# Patient Record
Sex: Male | Born: 1940
Health system: Southern US, Community
[De-identification: ages and names within clinical notes are randomized; demographics above are authoritative.]

## PROBLEM LIST (undated history)

## (undated) DIAGNOSIS — H409 Unspecified glaucoma: Secondary | ICD-10-CM

## (undated) DIAGNOSIS — I10 Essential (primary) hypertension: Secondary | ICD-10-CM

## (undated) DIAGNOSIS — G5602 Carpal tunnel syndrome, left upper limb: Secondary | ICD-10-CM

## (undated) DIAGNOSIS — K219 Gastro-esophageal reflux disease without esophagitis: Secondary | ICD-10-CM

## (undated) DIAGNOSIS — R351 Nocturia: Secondary | ICD-10-CM

## (undated) DIAGNOSIS — M1990 Unspecified osteoarthritis, unspecified site: Secondary | ICD-10-CM

## (undated) DIAGNOSIS — C61 Malignant neoplasm of prostate: Secondary | ICD-10-CM

## (undated) DIAGNOSIS — I2510 Atherosclerotic heart disease of native coronary artery without angina pectoris: Secondary | ICD-10-CM

## (undated) DIAGNOSIS — F419 Anxiety disorder, unspecified: Secondary | ICD-10-CM

## (undated) DIAGNOSIS — Z860100 Personal history of colon polyps, unspecified: Secondary | ICD-10-CM

## (undated) HISTORY — PX: CATARACT EXTRACTION W/ INTRAOCULAR LENS  IMPLANT, BILATERAL: SHX1307

## (undated) HISTORY — PX: PROSTATE BIOPSY: SHX241

## (undated) HISTORY — PX: OTHER SURGICAL HISTORY: SHX169

## (undated) HISTORY — PX: SHOULDER ARTHROSCOPY DISTAL CLAVICLE EXCISION AND OPEN ROTATOR CUFF REPAIR: SHX2396

## (undated) HISTORY — PX: COLONOSCOPY W/ POLYPECTOMY: SHX1380

## (undated) HISTORY — PX: CARPAL TUNNEL RELEASE: SHX101

---

## 2007-06-30 ENCOUNTER — Ambulatory Visit (HOSPITAL_BASED_OUTPATIENT_CLINIC_OR_DEPARTMENT_OTHER): Admission: RE | Admit: 2007-06-30 | Discharge: 2007-06-30 | Payer: Self-pay | Admitting: Orthopedic Surgery

## 2008-03-07 ENCOUNTER — Ambulatory Visit: Payer: Self-pay | Admitting: Gynecology

## 2008-08-06 ENCOUNTER — Ambulatory Visit (HOSPITAL_BASED_OUTPATIENT_CLINIC_OR_DEPARTMENT_OTHER): Admission: RE | Admit: 2008-08-06 | Discharge: 2008-08-07 | Payer: Self-pay | Admitting: Orthopedic Surgery

## 2009-05-20 ENCOUNTER — Encounter (INDEPENDENT_AMBULATORY_CARE_PROVIDER_SITE_OTHER): Payer: Self-pay | Admitting: Surgery

## 2009-05-20 ENCOUNTER — Observation Stay (HOSPITAL_COMMUNITY): Admission: RE | Admit: 2009-05-20 | Discharge: 2009-05-21 | Payer: Self-pay | Admitting: Surgery

## 2010-03-05 ENCOUNTER — Ambulatory Visit: Payer: Self-pay | Admitting: Genetic Counselor

## 2010-09-15 LAB — CBC
HCT: 43.5 % (ref 39.0–52.0)
MCHC: 34.6 g/dL (ref 30.0–36.0)
Platelets: 186 10*3/uL (ref 150–400)
RDW: 12.7 % (ref 11.5–15.5)

## 2010-09-15 LAB — DIFFERENTIAL
Lymphocytes Relative: 28 % (ref 12–46)
Lymphs Abs: 1.2 10*3/uL (ref 0.7–4.0)
Monocytes Absolute: 0.5 10*3/uL (ref 0.1–1.0)
Monocytes Relative: 11 % (ref 3–12)
Neutro Abs: 2.6 10*3/uL (ref 1.7–7.7)
Neutrophils Relative %: 60 % (ref 43–77)

## 2010-09-15 LAB — COMPREHENSIVE METABOLIC PANEL
Albumin: 4.2 g/dL (ref 3.5–5.2)
Alkaline Phosphatase: 77 U/L (ref 39–117)
BUN: 13 mg/dL (ref 6–23)
Calcium: 10 mg/dL (ref 8.4–10.5)
Creatinine, Ser: 0.98 mg/dL (ref 0.4–1.5)
Glucose, Bld: 93 mg/dL (ref 70–99)
Potassium: 4.5 mEq/L (ref 3.5–5.1)
Total Protein: 7.4 g/dL (ref 6.0–8.3)

## 2010-09-15 LAB — URINALYSIS, ROUTINE W REFLEX MICROSCOPIC
Bilirubin Urine: NEGATIVE
Glucose, UA: NEGATIVE mg/dL
Hgb urine dipstick: NEGATIVE
Specific Gravity, Urine: 1.009 (ref 1.005–1.030)
Urobilinogen, UA: 0.2 mg/dL (ref 0.0–1.0)
pH: 6 (ref 5.0–8.0)

## 2010-09-29 LAB — BASIC METABOLIC PANEL
CO2: 26 mEq/L (ref 19–32)
Calcium: 9.8 mg/dL (ref 8.4–10.5)
GFR calc Af Amer: >60 mL/min (ref >60)
Glucose, Bld: 103 mg/dL — ABNORMAL HIGH (ref 70–99)
Potassium: 3.8 mEq/L (ref 3.5–5.1)
Sodium: 143 mEq/L (ref 135–145)

## 2010-10-27 NOTE — Op Note (Signed)
NAMESAHITH, NURSE NO.:  [id number]   MEDICAL RECORD NO.:  [id number]          PATIENT TYPE:  AMB   LOCATION:  DSC                          FACILITY:  [facility]   PHYSICIAN:  Katy Fitch. Sypher, M.D. DATE OF BIRTH:  Oct 10, 1940   DATE OF PROCEDURE:  08/06/2008  DATE OF DISCHARGE:                               OPERATIVE REPORT   PREOPERATIVE DIAGNOSES:  1. Chronic degenerative rotator cuff tear, right shoulder, documented      by preoperative MRI.  2. Degenerative arthritis of acromioclavicular joint with unfavorable      inferior distal clavicle osteophyte and medial acromial osteophyte.  3. MRI documented labral tear, biceps tear.  4. Adhesive capsulitis, documented by examination of shoulder under      anesthesia prior to arthroscopy.   POSTOPERATIVE DIAGNOSES:  1. Significant degenerative type 1 superior labrum anterior and      posterior lesion.  2. 90-99% frayed biceps tendon tear.  3. Adhesive capsulitis documented by preoperative examination of right      shoulder under anesthesia and confirmed by direct inspection.  4. 95-99% PASTA articular surface rotator cuff tear involving entire      supraspinatus and infraspinatus tendons,.  5. Florid subacromial bursitis due to chronic impingement.   OPERATION:  1. Examination of right shoulder under anesthesia.  2. Diagnostic arthroscopy followed by arthroscopic debridement of      labrum, biceps tendon with biceps tenotomy.  3. Extensive debridement and electrocautery of adhesive capsulitis      granulation tissues.  4. Arthroscopic subacromial decompression with bursectomy,      coracoacromial ligament relaxation and anterior medial      acromioplasty.  5. Arthroscopic distal clavicle resection.  6. Reconstruction of rotator cuff with hybrid technique with      arthroscopic debridement and tuberosity plasty followed by open      double-row suture placement.  7. Biceps tenodesis at bicipital groove.   OPERATIONS:  Katy Fitch. Sypher, MD   ASSISTANT:  Marveen Reeks Dasnoit, PA-C   ANESTHESIA:  General endotracheal, supplemented by right interscalene  block.   SUPERVISING ANESTHESIOLOGIST:  Janetta Hora. 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  He has had a progressive right  shoulder pain predicament with night pain, weakness of abduction and  external rotation, difficulty reaching behind his back, and progressive  golf impairment.  We had discussed the shoulder predicament during the  past several months and identified a degenerative rotator cuff tear,  biceps pathology, and AC degenerative arthritis.  We attempted to rehab  his shoulder with therapy and activity modification, however, due to a  failure to respond he requested that we proceed with reconstruction of  his rotator cuff.   Larry Roberts goal is to be able to return to playing golf in the  summer of 2010.   Preoperatively, he was advised that based on his clinical examination  and MRI, he likely had a 95-99% articular surface rotator cuff tear,  extensive biceps pathology, labral pathology and due to his progressive  stiffness likely adhesive capsulitis.  We advised him that we proceed with examination of shoulder under  anesthesia, arthroscopic evaluation of the shoulder, subacromial  decompression, intraarticular debridement, distal clavicular resection,  and either arthroscopic or open reconstruction of the rotator cuff.   After informed consent, he is brought to the operating room at this  time.   Preoperatively, he was interviewed by Dr. Gelene Mink of the Anesthesia  Service.  Dr. Gelene Mink recommended general endotracheal anesthesia  supplemented by interscalene block.  Based on Larry Roberts age and  other factors, we elected to proceed with a lidocaine block for a short-  acting duration and less phrenic nerve  impairment.   After informed consent, Larry Roberts is brought to the operating room  at this time.   In the holding area, Dr. Gelene Mink placed the interscalene block without  complication leading to excellent anesthesia of the forequarter and arm.   Larry Roberts was brought to room #6, placed in the supine position on  the operating table, and under Dr. Thornton Dales direct supervision  general endotracheal anesthesia was induced.  He was carefully  positioned in the beach-chair position with aid of a torso and head  holder designed for shoulder arthroscopy.  Ancef 1 g was administered in  the holding area and 1 g of vancomycin was administered intraoperatively  as the anchors were placed.  The entire right upper extremity and  forequarter were prepped with DuraPrep and draped with impervious  arthroscopy drapes.  Examination of the right shoulder under anesthesia  revealed combined elevation of 160, loss of extension of at least 30  degrees due to adhesive capsulitis, external rotation of 80 degrees and  internal rotation of 60 degrees.  He clearly was losing motion due to  adhesive capsulitis and capsular scarring.  The scope was introduced  through a standard posterior viewing portal after distending the  shoulder with 40 mL of sterile saline.  Diagnostic arthroscopy confirmed  a severely degenerative biceps tendon.  A delaminated retracted partial  articular surface rotator cuff tear that involved 95-99% of the  footprint of the infraspinatus and supraspinatus.  The biceps tendon was  about 95% degenerative and hanging within the joint in shreds.  We  elected to proceed with a biceps tenotomy and tenodesis.   The adhesive capsulitis granulation tissues were meticulously debrided  followed by arthroscopic debridement of the tuberosity, tuberosity  plasty, and preparation for possible arthroscopic rotator cuff repair.   We then proceeded to debride the labrum and document the  pathology with  the digital camera.  The scope was then placed in the subacromial space  followed by bursectomy, relaxation of coracoacromial ligament, and  leveling the acromion to a type 1 morphology.  The capsule of AC joint  was identified followed by photographic documentation of the significant  degenerative arthritis of the distal clavicle.  The arthroscopic bur was  used to remove the distal 15 mm of the distal clavicle.  After  hemostasis achieved, we proceeded to evaluate for the fitness for an  arthroscopic repair.  Given Larry Roberts desire to return to golf as  soon as possible, I elected to perform a hybrid repair with double-row  technique and combined use of swivel lock, fiber tape technique, and a  standard 2-row FiberWire technique.   We elected to tenodesis the biceps with the anterior sutures, placed at  the anterior supraspinatus.   Two medial anchors were placed, one swivel lock posteriorly at the  center of the infraspinatus, one corkscrew at the  supraspinatus placed  anteriorly to facilitate biceps tenodesis.  The sutures were passed  through the tendon with Scorpion arthroscopic suture passer through a  muscle-splitting incision.  The suture construct involved 3 medial  footprint sutures insetting the PASTA lesion to the anatomic margin of  the articular surface followed by use of an over-the-top technique with  the mattress sutures to a lateral anterior swivel lock and a posterior  PushLock.  A finishing suture was used from the swivel lock to inset the  margin of the supraspinatus.  A very strong 2-row repair was achieved.   The scope was replaced in the glenohumeral joint confirming a  satisfactory biceps tenodesis at the repair, and the remnants of the  biceps tendon was then tenotomized with the suction shaver from an  anterior approach.  We had reestablished an anatomic footprint of the  repair and could visualize our sutures placed at the articular  margin.   After thorough lavage of the glenohumeral joint and subacromial space,  the scope was removed followed by repair of the portals with mattress  suture of 3-0 Prolene and repair of the muscle split with an apex suture  of #2 FiberWire and simple suture of 0 Vicryl.  The incision was  repaired with subcutaneous suture of 2-0 Vicryl and intradermal 3-0  Prolene with Steri-Strips.  There were no apparent complications.   Mr. Villella tolerated the surgery and anesthesia well.  He was  transferred to the recovery room with stable signs.  He will be  discharged in 24 hours to home with prescriptions for Dilaudid 2 mg 1-2  tablets p.o. q.4-6 h. p.r.n. pain, 30 tablets without refill.  Also,  Motrin 600 mg 1 p.o. q.6 h. p.r.n. pain, 30 tablets without refill and  Keflex 500 mg 1 p.o. q.8 h. x4 days as prophylactic antibiotic.      Katy Fitch Sypher, M.D.  Electronically Signed     RVS/MEDQ  D:  08/06/2008  T:  08/07/2008  Job:  [id number]   cc:   Georgann Housekeeper, MD

## 2010-10-27 NOTE — Op Note (Signed)
NAME:  Larry Roberts, Larry Roberts NO.:  [id number]   MEDICAL RECORD NO.:  [id number]          PATIENT TYPE:  AMB   LOCATION:  DSC                          FACILITY:  [facility]   PHYSICIAN:  Katy Fitch. Sypher, M.D. DATE OF BIRTH:  01/03/1941   DATE OF PROCEDURE:  06/30/2007  DATE OF DISCHARGE:                               OPERATIVE REPORT   PREOPERATIVE DIAGNOSES:  1. Entrapment neuropathy median nerve right carpal tunnel.  2. Stenosing tenosynovitis right index finger at A1 pulley.  3. Stenosing tenosynovitis right long finger at A1 pulley.   POSTOPERATIVE DIAGNOSES:  1. Entrapment neuropathy median nerve right carpal tunnel.  2. Stenosing tenosynovitis right index finger at A1 pulley.  3. Stenosing tenosynovitis right long finger at A1 pulley.   OPERATION:  1. Release of right transverse carpal ligament.  2. Release of right index finger A1 pulley.  3. Release of right long finger A1 pulley.   OPERATIONS:  Katy Fitch. Sypher, M.D.   ASSISTANT:  Annye Rusk, PA-C.   ANESTHESIA:  General by LMA.   SUPERVISING ANESTHESIOLOGIST:  Dr. Randa Evens.   INDICATIONS:  Larry Roberts is a 70 year old gentleman referred for  evaluation and management of stenosing tenosynovitis of multiple fingers  and right carpal tunnel syndrome.   Due to a failure to respond to nonoperative measures, he is brought to  the operating room at this time for release of his right transverse  carpal ligament and release of his index and long trigger fingers.  Preoperatively, question were invited and  answered in detail.  He was  advised of potential risks and benefits of surgery.   DESCRIPTION OF PROCEDURE:  Larry Roberts was brought to the operating  room and placed in the supine position upon the operating table.   Following the induction of general anesthesia by LMA technique, the  right arm was prepped with Betadine soap and solution and sterilely  draped.   Following exsanguination of  the right arm with an Esmarch bandage, the  arterial tourniquet was inflated to 220 mmHg.  Procedure commenced with  a short incision in the line of the ring finger and the palm.  Subcutaneous tissues were carefully divided in the middle of the palmar  fascia.  This was split longitudinally to reveal the common sensory  branch of the median nerve.  These were followed back to the transverse  carpal ligament where the median nerve was gently isolated from the  transverse carpal ligament with a Penfield IV Engineer, structural.   The transverse carpal ligament released along its ulnar border extending  into the distal forearm.  This widely opened the carpal canal.  No mass  or other predicaments were noted.   Bleeding points along the margin of the loose ligament were  electrocauterized with bipolar current followed by repair of the skin  with intradermal 3-0 Prolene suture.   Attention then directed to the right index finger.  An oblique incision  was fashioned in the middle palmar crease.  Subcutaneous tissues were  carefully divided taking care to identify the radial proper digital  nerve and artery to the index finger.  Retractors were placed followed  by isolation of the A1 pulley.  An A zero pulley was noted and released  and the A1 pulley was released along its radial border.  The triggering  the finger was then relieved.  The wound was repaired with intradermal 3-  0 Prolene.   Attention then directed to the long finger.  A oblique incision was  fashioned in the distal palmar crease.  Subcutaneous tissues were  carefully divided, taking care to isolate the A1 pulley.  The pulley was  released along its radial border.  Tendon was delivered and hypertrophic  tenosynovium removed.   The wound was then repaired with intradermal 3-0 Prolene suture.   A compressive dressing was applied with a volar plaster splint on the  wrist.  The fingers were dressed with sterile gauze and Ace wrap.   There  were no apparent complications.   For aftercare, Larry Roberts is provided prescription for Percocet 5 mg  one p.o. q.4-6h. p.r.n. pain, 20 tablets without refill.   He will return to see [place] in the office for follow-up in a week for  dressing change and initiation of therapy program.      Katy Fitch. Sypher, M.D.  Electronically Signed     RVS/MEDQ  D:  06/30/2007  T:  06/30/2007  Job:  [id number]

## 2011-03-05 LAB — BASIC METABOLIC PANEL
CO2: 29
Calcium: 9.9
GFR calc Af Amer: >60
GFR calc non Af Amer: >60
Glucose, Bld: 113 — ABNORMAL HIGH
Potassium: 4.8
Sodium: 139

## 2013-07-14 ENCOUNTER — Emergency Department (HOSPITAL_COMMUNITY)
Admission: EM | Admit: 2013-07-14 | Discharge: 2013-07-14 | Disposition: A | Discharge status: Home / Self Care | Payer: Medicare Other | Source: Home / Self Care | Attending: Emergency Medicine | Admitting: Emergency Medicine

## 2013-07-14 ENCOUNTER — Encounter (HOSPITAL_COMMUNITY): Payer: Self-pay | Admitting: Emergency Medicine

## 2013-07-14 DIAGNOSIS — H9192 Unspecified hearing loss, left ear: Secondary | ICD-10-CM

## 2013-07-14 DIAGNOSIS — H9190 Unspecified hearing loss, unspecified ear: Secondary | ICD-10-CM

## 2013-07-14 MED ORDER — PREDNISONE 10 MG PO TABS: ORAL_TABLET | ORAL | Status: DC

## 2013-07-14 NOTE — Discharge Instructions (Signed)
Hearing Loss  A hearing loss is sometimes called deafness. Hearing loss may be partial or total.  CAUSES  Hearing loss may be caused by:   Wax in the ear canal.   Infection of the ear canal.   Infection of the middle ear.   Trauma to the ear or surrounding area.   Fluid in the middle ear.   A hole in the eardrum (perforated eardrum).   Exposure to loud sounds or music.   Problems with the hearing nerve.   Certain medications.  Hearing loss without wax, infection, or a history of injury may mean that the nerve is involved. Hearing loss with severe dizziness, nausea and vomiting or ringing in the ear may suggest a hearing nerve irritation or problems in the middle or inner ear. If hearing loss is untreated, there is a greater likelihood for residual or permanent hearing loss.  DIAGNOSIS  A hearing test (audiometry) assesses hearing loss. The audiometry test needs to be performed by a hearing specialist (audiologist).  TREATMENT  Treatment for recent onset of hearing loss may include:   Ear wax removal.   Medications that kill germs (antibiotics).   Cortisone medications.   Prompt follow up with the appropriate specialist.  Return of hearing depends on the cause of your hearing loss, so proper medical follow-up is important. Some hearing loss may not be reversible, and a caregiver should discuss care and treatment options with you.  SEEK MEDICAL CARE IF:    You have a severe headache, dizziness, or changes in vision.   You have new or increased weakness.   You develop repeated vomiting or other serious medical problems.   You have a fever.  Document Released: 05/31/2005 Document Revised: 08/23/2011 Document Reviewed: 09/25/2009  [employer] Patient Information 2014 [employer].

## 2013-07-14 NOTE — ED Notes (Signed)
C/o left hearing problem States he only hears mumbles Did hear a wind whistling, ringing, and water running noises in ear No treatments tried

## 2013-07-14 NOTE — ED Provider Notes (Signed)
  Chief Complaint   Chief Complaint  Patient presents with  . Hearing Problem    History of Present Illness   Santo MAXON KRESSE is a 73 year old male who had sudden onset of tinnitus described as a roaring or blowing sensation in his left ear along with decreased hearing which began last night. He denies any ear pain. The right ear has been normal. He's had no fever, chills, headache, nasal congestion, rhinorrhea, sore throat, or adenopathy. He denies any neurological symptoms.  Review of Systems   Other than as noted above, the patient denies any of the following symptoms: Systemic:  No fevers, chills, or sweats. Eye:  No redness, pain, discharge, itching, blurred vision, or diplopia. ENT:  No headache, nasal congestion, sneezing, itching, epistaxis, ear pain, congestion, decreased hearing, ringing in ears, vertigo, or tinnitus.  No oral lesions, sore throat, pain on swallowing, or hoarseness. Neck:  No mass, tenderness or adenopathy. Lungs:  No coughing, wheezing, or shortness of breath. Skin:  No rash or itching.  [place]   Past medical history, family history, social history, meds, and allergies were reviewed. He is allergic to tetanus vaccine. He takes medicine for his blood pressure, aspirin, and Crestor.  Physical Examination     Vital signs:  BP 167/93  Pulse 90  Temp(Src) 98.5 F (36.9 C) (Oral)  Resp 18  SpO2 99% General:  Alert and oriented.  In no distress.  Skin warm and dry. Eye:  PERRL, full EOMs, lids and conjunctiva normal.   ENT:  TMs and canals clear.  Nasal mucosa not congested and without drainage.  Mucous membranes moist, no oral lesions, normal dentition, pharynx clear.  No cranial or facial pain to palplation. Hearing was diminished the fingers rubbed together on the left side. He was able to be or whispered numbers on both sides equally. Neck:  Supple, full ROM.  No adenopathy, tenderness or mass.  Thyroid normal. Lungs:  Breath sounds clear and equal  bilaterally.  No wheezes, rales or rhonchi. Heart:  Rhythm regular, without extrasystoles.  No gallops or murmers. Skin:  Clear, warm and dry.   Assessment   The encounter diagnosis was Hearing loss in left ear.  Sudden unexplained hearing loss in left ear suggest either vascular or inflammatory cause. Will treat with prednisone and have him followup with ENT early next week.  Plan    1.  Meds:  The following meds were prescribed:   Discharge Medication List as of 07/14/2013 10:00 AM    START taking these medications   Details  predniSONE (DELTASONE) 10 MG tablet Take 4 tablets daily for 4 days, 2 tablets daily for 4 days, 1 tablet daily for 4 days., Normal        2.  Patient Education/Counseling:  The patient was given appropriate handouts, self care instructions, and instructed in symptomatic relief.    3.  Follow up:  The patient was told to follow up here if no better in 3 to 4 days, or sooner if becoming worse in any way, and given some red flag symptoms such as vertigo or any new neurological symptoms which would prompt immediate return.  Follow up with Dr. Erik Obey as soon as possible.     Harden Mo, MD 07/14/13 [phone]

## 2014-04-29 ENCOUNTER — Ambulatory Visit
Admission: RE | Admit: 2014-04-29 | Discharge: 2014-04-29 | Disposition: A | Discharge status: Home / Self Care | Payer: Medicare Other | Source: Ambulatory Visit | Attending: Internal Medicine | Admitting: Internal Medicine

## 2014-04-29 ENCOUNTER — Other Ambulatory Visit: Payer: Self-pay | Admitting: Internal Medicine

## 2014-04-29 DIAGNOSIS — R079 Chest pain, unspecified: Secondary | ICD-10-CM

## 2016-06-14 HISTORY — PX: SHOULDER ARTHROSCOPY WITH OPEN ROTATOR CUFF REPAIR: SHX6092

## 2017-07-14 ENCOUNTER — Other Ambulatory Visit: Payer: Self-pay | Admitting: Urology

## 2017-07-14 DIAGNOSIS — C61 Malignant neoplasm of prostate: Secondary | ICD-10-CM

## 2017-07-26 ENCOUNTER — Ambulatory Visit
Admission: RE | Admit: 2017-07-26 | Discharge: 2017-07-26 | Disposition: A | Discharge status: Home / Self Care | Payer: Medicare Other | Source: Ambulatory Visit | Attending: Urology | Admitting: Urology

## 2017-07-26 DIAGNOSIS — C61 Malignant neoplasm of prostate: Secondary | ICD-10-CM

## 2017-07-26 MED ORDER — GADOBENATE DIMEGLUMINE 529 MG/ML IV SOLN
19.0000 mL | Freq: Once | INTRAVENOUS | Status: AC | PRN
  Administered 2017-07-26: 19 mL via INTRAVENOUS

## 2017-09-26 ENCOUNTER — Telehealth: Payer: Self-pay | Admitting: Medical Oncology

## 2017-09-26 NOTE — Telephone Encounter (Signed)
I called pt to introduce myself as the Prostate Nurse Navigator and the Coordinator of the [facility].  1. I confirmed with the patient he is aware of his referral to the clinic 10/04/17 arriving at 12:30pm.  2. I discussed the format of the clinic and the physicians he will be seeing that day.  3. I discussed where the clinic is located and how to contact me. He informed me his wife is a breast cancer survivor and sees Dr. Jana Hakim so he is very familiar with the cancer center.  4. I confirmed his address and informed him I would be mailing a packet of information and forms to be completed. I asked him to bring them with him the day of his appointment.   He voiced understanding of the above. I asked him to call me if he has any questions or concerns regarding his appointments or the forms he needs to complete.

## 2017-09-27 ENCOUNTER — Encounter: Payer: Self-pay | Admitting: Medical Oncology

## 2017-10-03 ENCOUNTER — Encounter: Payer: Self-pay | Admitting: Radiation Oncology

## 2017-10-03 ENCOUNTER — Telehealth: Payer: Self-pay | Admitting: Medical Oncology

## 2017-10-03 DIAGNOSIS — C61 Malignant neoplasm of prostate: Secondary | ICD-10-CM | POA: Insufficient documentation

## 2017-10-03 NOTE — Telephone Encounter (Signed)
Called patient and spoke with his wife to confirm appointment for [facility] arriving at 12:30pm. I reminded him to bring his completed medical forms and to have lunch before arrival due to the length of the clinic.

## 2017-10-03 NOTE — Progress Notes (Signed)
GU Location of Tumor / Histology: prostatic adenocarcinoma  If Prostate Cancer, Gleason Score is (3 + 3) and PSA is (3.74) on 09/15/2017. Prostate volume: 62.8 cc.  Radene Journey presented to Dr. Alinda Money in September 2017 with an elevated PSA of 4.69.   09/25/2017 PSA  3.74 03/16/2017 PSA  4.45 09/15/2016 PSA  3.96 01/2017  PSA  6.81 12/23/2016 PSA  6.80 03/11/2016 PSA  3.47 2013  PSA  3.38   Biopsies of prostate (if applicable) revealed:    Past/Anticipated interventions by urology, if any: prostate biopsy  Past/Anticipated interventions by medical oncology, if any: no  Weight changes, if any: no  Bowel/Bladder complaints, if any: nocturia   Nausea/Vomiting, if any: no  Pain issues, if any:  no  SAFETY ISSUES:  Prior radiation? no  Pacemaker/ICD? no  Possible current pregnancy? no  Is the patient on methotrexate? no  Current Complaints / other details:  77 year old male. Married. 2 daughters. Brother with hx of prostate ca.

## 2017-10-04 ENCOUNTER — Encounter: Payer: Self-pay | Admitting: General Practice

## 2017-10-04 ENCOUNTER — Other Ambulatory Visit: Payer: Self-pay

## 2017-10-04 ENCOUNTER — Encounter: Payer: Self-pay | Admitting: Radiation Oncology

## 2017-10-04 ENCOUNTER — Encounter: Payer: Self-pay | Admitting: Medical Oncology

## 2017-10-04 ENCOUNTER — Ambulatory Visit
Admission: RE | Admit: 2017-10-04 | Discharge: 2017-10-04 | Disposition: A | Discharge status: Home / Self Care | Payer: Medicare Other | Source: Ambulatory Visit | Attending: Radiation Oncology | Admitting: Radiation Oncology

## 2017-10-04 ENCOUNTER — Inpatient Hospital Stay: Payer: Medicare Other | Attending: Oncology | Admitting: Oncology

## 2017-10-04 VITALS — BP 157/78 | HR 62 | Temp 98.0°F | Resp 18 | Ht 70.0 in | Wt 196.6 lb

## 2017-10-04 DIAGNOSIS — K219 Gastro-esophageal reflux disease without esophagitis: Secondary | ICD-10-CM | POA: Diagnosis not present

## 2017-10-04 DIAGNOSIS — Z79899 Other long term (current) drug therapy: Secondary | ICD-10-CM | POA: Diagnosis not present

## 2017-10-04 DIAGNOSIS — Z806 Family history of leukemia: Secondary | ICD-10-CM | POA: Insufficient documentation

## 2017-10-04 DIAGNOSIS — Z8042 Family history of malignant neoplasm of prostate: Secondary | ICD-10-CM | POA: Diagnosis not present

## 2017-10-04 DIAGNOSIS — C61 Malignant neoplasm of prostate: Secondary | ICD-10-CM | POA: Insufficient documentation

## 2017-10-04 DIAGNOSIS — Z7982 Long term (current) use of aspirin: Secondary | ICD-10-CM | POA: Diagnosis not present

## 2017-10-04 HISTORY — DX: Malignant neoplasm of prostate: C61

## 2017-10-04 HISTORY — DX: Personal history of colon polyps, unspecified: Z86.0100

## 2017-10-04 HISTORY — DX: Gastro-esophageal reflux disease without esophagitis: K21.9

## 2017-10-04 HISTORY — DX: Personal history of colonic polyps: Z86.010

## 2017-10-04 NOTE — Progress Notes (Signed)
Radiation Oncology         [phone] ________________________________  Multidisciplinary Prostate Cancer Clinic  Initial Radiation Oncology Consultation  Name: Larry Roberts MRN: [id number]  Date: 10/04/2017  DOB: March 29, 1941  YP:[id number], D[place] Ar, MD  Raynelle Bring, MD   REFERRING PHYSICIAN: Raynelle Bring, MD  DIAGNOSIS: 77 y.o. gentleman with stage T2a adenocarcinoma of the prostate with a Gleason's score of 3+4 and a PSA of 3.74    ICD-10-CM   1. Malignant neoplasm of prostate ([place]) [place] ILLNESS::Larry Roberts is a 77 y.o. gentleman with history of elevated PSA and prostate cancer.  PSA's  02/14/14 - 3.29  02/27/15 - 3.58  08/28/15 - 3.77  03/04/16 - 4.69  03/11/16 - 3.47  09/02/16 - 4.82  09/15/16 - 3.96  12/23/16 - 6.8   01/21/17 - TRUS vol = 62.8   5/12 cores positive max Gleason 3+3   Elected active surveillance at the time because his wife was diagnosed with breast cancer.  07/26/17 - MRI prostate 13 mm PI-RADS 4 lesion the right anterior apical gland. No signs of extracapsular extension or seminal vesicle involvement.  09/15/17 - PSA 3.74  09/15/17 - TRUS vol 71 gm with MRI Fusion Biopsy    4/12 cores positive with max Gleason 3+4 and 2/2 ROI cores both positive for 3+4      The patient reviewed the biopsy results with his urologist and he has kindly been referred today to the multidisciplinary prostate cancer clinic for presentation of pathology and radiology studies in our conference for discussion of potential radiation treatment options and clinical evaluation.  Of note, the patient has a history of skin cancer diagnosed at age 77 that was removed.   PREVIOUS RADIATION THERAPY: No  PAST MEDICAL HISTORY:  has a past medical history of GERD (gastroesophageal reflux disease), Glaucoma, colonic polyps, and Prostate cancer ([place]).    PAST SURGICAL HISTORY: Past Surgical History:  Procedure Laterality Date  . APPENDECTOMY    .  CARPAL TUNNEL RELEASE Right   . COLONOSCOPY W/ POLYPECTOMY    . PROSTATE BIOPSY    . SHOULDER SURGERY Left   . SHOULDER SURGERY Right     FAMILY HISTORY: family history includes Breast cancer in his daughter, sister, and sister; Hodgkin's lymphoma in his daughter; Melanoma in his daughter; Prostate cancer in his brother; Throat cancer in his father; Thyroid cancer in his sister.  SOCIAL HISTORY:  reports that he has never smoked. He has never used smokeless tobacco. He reports that he does not drink alcohol or use drugs.  ALLERGIES: Tetanus toxoids  MEDICATIONS:  Current Outpatient Medications  Medication Sig Dispense Refill  . ALPRAZolam (XANAX) 0.5 MG tablet   0  . amLODipine (NORVASC) 5 MG tablet   0  . Coenzyme Q10 (CO Q 10 PO) Take by mouth.    . dorzolamide-timolol (COSOPT) 22.3-6.8 MG/ML ophthalmic solution   6  . latanoprost (XALATAN) 0.005 % ophthalmic solution INSTILL 1 DROP INTO BOTH EYES NIGHTLY  2  . Multiple Vitamins-Minerals (OSTEO COMPLEX PO) Take by mouth.    . rosuvastatin (CRESTOR) 5 MG tablet Take 5 mg by mouth daily.     No current facility-administered medications for this encounter.     REVIEW OF SYSTEMS:  A 15 point review of systems is documented in the electronic medical record. This was obtained by the nursing staff. However, I reviewed this with the patient to discuss relevant findings and make appropriate changes.  Pertinent items noted in HPI and remainder of comprehensive ROS otherwise negative. The patient completed an IPSS and IIEF questionnaire.  His IPSS score was 9 indicating moderate urinary outflow obstructive symptoms of frequency, weak stream, and nocturia x3.  He indicated that his erectile function is able to complete sexual activity most times.   PHYSICAL EXAM: This patient is in no acute distress.  He is alert and oriented.   height is 5\' 10"  (1.778 m) and weight is 196 lb 9.6 oz (89.2 kg). His oral temperature is 98 F (36.7 C). His blood  pressure is 157/78 (abnormal) and his pulse is 62. His respiration is 18 and oxygen saturation is 100%.  He exhibits no respiratory distress or labored breathing.  He appears neurologically intact.  His mood is pleasant.  His affect is appropriate.  Lungs are clear to auscultation.  Heart is regular in rate and rhythm. Please note the digital rectal exam findings described above.  KPS = 100  100 - Normal; no complaints; no evidence of disease. 90   - Able to carry on normal activity; minor signs or symptoms of disease. 80   - Normal activity with effort; some signs or symptoms of disease. 55   - Cares for self; unable to carry on normal activity or to do active work. 60   - Requires occasional assistance, but is able to care for most of his personal needs. 50   - Requires considerable assistance and frequent medical care. 36   - Disabled; requires special care and assistance. 33   - Severely disabled; hospital admission is indicated although death not imminent. 39   - Very sick; hospital admission necessary; active supportive treatment necessary. 10   - Moribund; fatal processes progressing rapidly. 0     - Dead  Karnofsky DA, Abelmann Clyde, Craver LS and Burchenal [facility] [phone]) The use of the nitrogen mustards in the palliative treatment of carcinoma: with particular reference to bronchogenic carcinoma Cancer 1 [id number]   LABORATORY DATA:  Lab Results  Component Value Date   WBC 4.4 05/15/2009   HGB 15.1 05/15/2009   HCT 43.5 05/15/2009   MCV 89.4 05/15/2009   PLT 186 05/15/2009   Lab Results  Component Value Date   NA 142 05/15/2009   K 4.5 05/15/2009   CL 107 05/15/2009   CO2 27 05/15/2009   Lab Results  Component Value Date   ALT 32 05/15/2009   AST 28 05/15/2009   ALKPHOS 77 05/15/2009   BILITOT 0.9 05/15/2009     RADIOGRAPHY: No results found.    IMPRESSION: This gentleman is a 77 y.o. gentleman with stage T2a adenocarcinoma of the prostate with a Gleason's score of 3+4 and  a PSA of 3.74.  His T-Stage, Gleason's Score, and PSA put him into the favorable intermediate risk group.  Accordingly he is eligible for a variety of potential treatment options including prostatectomy or external radiation. He is not an ideal seed implant candidate due to his large prostate volume of 71 cc.  PLAN:Today I reviewed the findings and workup thus far.  We discussed the natural history of prostate cancer.  We reviewed the the implications of T-stage, Gleason's Score, and PSA on decision-making and outcomes in prostate cancer.  We discussed radiation treatment in the management of prostate cancer with regard to the logistics and delivery of external beam radiation treatment. We compared this against prostatectomy.  The patient expressed interest in external beam radiotherapy.    The patient would  like to proceed with prostate IMRT.  I will share my findings with Dr. Alinda Money and move forward with scheduling placement of three gold fiducial markers into the prostate to proceed with IMRT in the near future.     I enjoyed meeting with him today, and will look forward to participating in the care of this very nice gentleman.   I spent time face to face with the patient and more than 50% of that time was spent in counseling and/or coordination of care.   ------------------------------------------------  Sheral Apley. Tammi Klippel, M.D.  This document serves as a record of services personally performed by Tyler Pita, MD. It was created on his behalf by Rae Lips, a trained medical scribe. The creation of this record is based on the scribe's personal observations and the provider's statements to them. This document has been checked and approved by the attending provider.

## 2017-10-04 NOTE — Progress Notes (Signed)
                               Care Plan Summary  Name: Mr. Larry Roberts DOB: 01-01-1941   Your Medical Team:   Urologist -  Dr. Raynelle Bring, [facility]  Radiation Oncologist - Dr. Tyler Pita, [facility]   Medical Oncologist - Dr. Zola Button, [place]  Recommendations: 1) Prostatectomy 2) Radiation - 5 1/2 weeks   * These recommendations are based on information available as of today's consult.      Recommendations may change depending on the results of further tests or exams.   Next Steps: 1) Dr. Alinda Money will schedule fiducial markers and [facility]  2) Dr. Johny Shears office will schedule radiation planning and treatments   When appointments need to be scheduled, you will be contacted by [facility] and/or [facility].  Patient provided with business cards for all team members and given a copy of "Fall Prevention Patient Safety Plan".  Questions?  Please do not hesitate to call Cira Rue, RN, BSN, OCN at [phone] any questions or concerns.  Shirlean Mylar is your Oncology Nurse Navigator and is available to assist you while you're receiving your medical care at [facility].

## 2017-10-04 NOTE — Progress Notes (Signed)
Reason for Referral: Prostate cancer  HPI: 77 year old gentleman currently of [place] where he lived the majority of his life.  He is a rather healthy gentleman who has a history of elevated PSA dating back to August 2018 at that time his PSA was 6.8.  Biopsy in August 2018 showed a Gleason score 3+3 = 6 in 4 out of 12 cores.  This was performed by Dr. Alinda Money and he elected to proceed with active surveillance.  His repeat PSA in April 2018 was 3.74 although an MRI obtained in February 2019 showed a apical nodule that is highly suspicious for high-grade disease.  Repeat biopsy obtained on September 15, 2017 showed multiple cores of Gleason score 3+4 = 7 indicating high-grade malignancy.  He is otherwise asymptomatic from these findings.  He denies any recent urinary symptoms including frequency or hematuria.  He does report some mild nocturia which has been manageable.  He does not report any headaches, blurry vision, syncope or seizures. Does not report any fevers, chills or sweats.  Does not report any cough, wheezing or hemoptysis.  Does not report any chest pain, palpitation, orthopnea or leg edema.  Does not report any nausea, vomiting or abdominal pain.  Does not report any constipation or diarrhea.  Does not report any skeletal complaints.    Does not report frequency, urgency or hematuria.  Does not report any skin rashes or lesions. Does not report any heat or cold intolerance.  Does not report any lymphadenopathy or petechiae.  Does not report any anxiety or depression.  Remaining review of systems is negative.    Past Medical History:  Diagnosis Date  . GERD (gastroesophageal reflux disease)   . Hx of colonic polyps   . Prostate cancer [facility])   :  Past Surgical History:  Procedure Laterality Date  . APPENDECTOMY    . CARPAL TUNNEL RELEASE Right   . COLONOSCOPY W/ POLYPECTOMY    . PROSTATE BIOPSY    . SHOULDER SURGERY Left   . SHOULDER SURGERY Right   :   Current Outpatient  Medications:  .  ALPRAZolam (XANAX) 0.5 MG tablet, , Disp: , Rfl: 0 .  amLODipine (NORVASC) 5 MG tablet, , Disp: , Rfl: 0 .  latanoprost (XALATAN) 0.005 % ophthalmic solution, INSTILL 1 DROP INTO BOTH EYES NIGHTLY, Disp: , Rfl: 2 .  predniSONE (DELTASONE) 10 MG tablet, Take 4 tablets daily for 4 days, 2 tablets daily for 4 days, 1 tablet daily for 4 days., Disp: 28 tablet, Rfl: 0 .  sulfamethoxazole-trimethoprim (BACTRIM DS,SEPTRA DS) 800-160 MG tablet, take 1 tablet by mouth twice a day *BEGIN DAY PROCEDURE AND CONTINUE FOR 3 DAYS*, Disp: , Rfl: 0:  No Known Allergies:  Family History  Problem Relation Age of Onset  . Prostate cancer Brother   . Hodgkin's lymphoma Daughter   . Breast cancer Daughter   . Melanoma Daughter   . Throat cancer Father   . Breast cancer Sister   :  Social History   Socioeconomic History  . Marital status: Married    Spouse name: Not on file  . Number of children: 12  . Years of education: Not on file  . Highest education level: Not on file  Occupational History  . Not on file  Social Needs  . Financial resource strain: Not on file  . Food insecurity:    Worry: Not on file    Inability: Not on file  . Transportation needs:    Medical: Not on file  Non-medical: Not on file  Tobacco Use  . Smoking status: Never Smoker  . Smokeless tobacco: Never Used  Substance and Sexual Activity  . Alcohol use: Never    Frequency: Never  . Drug use: Never  . Sexual activity: Not on file  Lifestyle  . Physical activity:    Days per week: Not on file    Minutes per session: Not on file  . Stress: Not on file  Relationships  . Social connections:    Talks on phone: Not on file    Gets together: Not on file    Attends religious service: Not on file    Active member of club or organization: Not on file    Attends meetings of clubs or organizations: Not on file    Relationship status: Not on file  . Intimate partner violence:    Fear of current or ex  partner: Not on file    Emotionally abused: Not on file    Physically abused: Not on file    Forced sexual activity: Not on file  Other Topics Concern  . Not on file  Social History Narrative  . Not on file  :    Exam: ECOG 0 General appearance: alert and cooperative appeared without distress. Head: atraumatic without any abnormalities. Eyes: conjunctivae/corneas clear. PERRL.  Sclera anicteric. Throat: lips, mucosa, and tongue normal; without oral thrush or ulcers. Resp: clear to auscultation bilaterally without rhonchi, wheezes or dullness to percussion. Cardio: regular rate and rhythm, S1, S2 normal, no murmur, click, rub or gallop GI: soft, non-tender; bowel sounds normal; no masses,  no organomegaly Skin: Skin color, texture, turgor normal. No rashes or lesions Lymph nodes: Cervical, supraclavicular, and axillary nodes normal. Neurologic: Grossly normal without any motor, sensory or deep tendon reflexes. Musculoskeletal: No joint deformity or effusion.  CBC    Component Value Date/Time   WBC 4.4 05/15/2009 1130   RBC 4.87 05/15/2009 1130   HGB 15.1 05/15/2009 1130   HCT 43.5 05/15/2009 1130   PLT 186 05/15/2009 1130   MCV 89.4 05/15/2009 1130   MCHC 34.6 05/15/2009 1130   RDW 12.7 05/15/2009 1130   LYMPHSABS 1.2 05/15/2009 1130   MONOABS 0.5 05/15/2009 1130   EOSABS 0.1 05/15/2009 1130   BASOSABS 0.0 05/15/2009 1130      Chemistry      Component Value Date/Time   NA 142 05/15/2009 1130   K 4.5 05/15/2009 1130   CL 107 05/15/2009 1130   CO2 27 05/15/2009 1130   BUN 13 05/15/2009 1130   CREATININE 0.98 05/15/2009 1130      Component Value Date/Time   CALCIUM 10.0 05/15/2009 1130   ALKPHOS 77 05/15/2009 1130   AST 28 05/15/2009 1130   ALT 32 05/15/2009 1130   BILITOT 0.9 05/15/2009 1130       Assessment and Plan:   77 year old gentleman with prostate cancer diagnosed in August 2018 at that time his PSA was 6.8 and a Gleason score 3+3 = 6.  After  repeat of active surveillance, he developed a a lesion in the anterior right apex suspicious for high-grade microscopic prostate cancer that was a biopsy proven to be Gleason score 3+4 = 7 and 4 cores.  His case was discussed in the prostate cancer multidisciplinary clinic including review of his pathology as well as imaging studies.  Options of therapy were reviewed including primary surgical therapy versus radiation with short course of androgen deprivation.  Risks and benefits of all these approaches were  reviewed today and the rationale for using androgen deprivation in this case was discussed today.  Complication associated with ADT including weight gain, hot flashes, sexual dysfunction among others were reviewed.  I see no role for any additional systemic therapy in the form of chemotherapy or androgen synthesis inhibitors.  After discussion today he is leaning towards proceeding with radiation therapy with the addition of short-term androgen deprivation as well.  All his questions were answered to her satisfaction.  30  minutes was spent with the patient face-to-face today.  More than 50% of time was dedicated to patient counseling, education and coordination of his care.

## 2017-10-04 NOTE — Consult Note (Signed)
[facility]     10/04/2017   --------------------------------------------------------------------------------   Larry Roberts  MRN: [id number]  PRIMARY CARE:  Wenda Low, MD  DOB: Feb 28, 1941, 77 year old Male  REFERRING:  Luvenia Redden  SSN: [id number]  PROVIDER:  Raynelle Bring, M.D.    LOCATION:  [facility] [phone]   --------------------------------------------------------------------------------   CC/HPI: CC: Prostate Cancer   PCP: Dr. Wenda Low  Location of consult: [facility]   Larry Roberts is a 77 year old gentleman who presented to me in the summer of 2018 for a persistently elevated PSA of 6.81 and right sided nodularity of the mid/base gland. He underwent a TRUS biopsy on 01/21/17 that demonstrated Gleason 3+3=6 adenocarcinoma of the prostate in 5 out of 12 biopsies. He initially delayed discussion and a decision about management of his prostate cancer due to the fact that his wife was diagnosed with breast cancer and began her treatment. He eventually returned to discuss his options in October 2018 and we agreed to proceed with an initial strategy of active surveillance. He underwent an MRI of the prostate in February 2019 that did demonstrate a 1.3 cm right anterior apical PI-RADS 4 lesion of the prostate. An MR/US fusion biopsy was then performed on 09/15/17 with targeted biopsies of his lesion and extended core sampling of the prostate. 8 out of 30 biopsies were positive including 4 out of 4 targeted biopsies with upgraded 3+4=7 adenocarcinoma. Interestingly, his PSA performed right before his recent biopsy was decreased to 3.74.   Family history: Brother   Imaging studies: MRI (07/26/17): No EPE, SVI, or LAD.   PMH: He has a history of GERD.  PSH: Appendectomy.   TNM stage: cT2 N0 Mx  PSA: 3.74  Gleason score: 3+4=7  Biopsy (09/15/17): 8/30 cores positive  Left: L  apex (20% of one core, 3+3=6), L mid lateral (5% of one core, 3+3=6), L base lateral (5% of one core, 3+3=6)  Right: R apex lateral (40% of one core, 3+4=7)  MR targeted lesions (1.3 cm right anterior apical): 4/4 cores (70%, 50%, 50%, 50%, 3+4=7)  Prostate volume: 71.0 cc  PSAD: 0.05   Nomogram  OC disease: 56%  EPE: 45%  SVI: 1%  LNI: 1%  PFS (5 year, 10 year): 93%, 87%   Urinary function: IPSS is 9  Erectile function: SHIM score is 20.     ALLERGIES: Levaquin Tetanus    MEDICATIONS: Alprazolam 0.25 mg tablet  Amlodipine Besylate 5 mg tablet  Aspirin Ec 81 mg tablet, delayed release  Coq10  Dorzolamide Hcl 2 % drops  Osteo Bi-Flex  Rosuvastatin Calcium 10 mg tablet  Zantac 150 mg tablet     GU PSH: Prostate Needle Biopsy - 09/15/2017, 01/21/2017    NON-GU PSH: Appendectomy. Carpal Tunnel Surgery.., Right Colonoscopy & Polypectomy Shoulder Arthroscopy/surgery, Left Shoulder Surgery (Unspecified), Right Surgical Pathology, Gross And Microscopic Examination For Prostate Needle - 09/15/2017, 01/21/2017    GU PMH: Prostate Cancer - 03/22/2017 Elevated PSA - 03/11/2016      PMH Notes:   1) Elevated PSA: He initially presented to me in September 2017 with an elevated PSA of 4.69. His PSA was repeated and decreased to 3.47, which was stable from his baseline of 3.38 in 2013. We therefore elected to proceed with surveillance monitoring of his PSA considering his age and life expectancy. His PSA increased to 6.8 in August 2018 prompting a prostate biopsy.  He was diagnosed with low risk prostate cancer and elected active surveillance.   Initial diagnosis: August 2018  Clinical stage: cT1c Nx Mx  PSA at diagnosis: 6.81  Biopsy (01/21/17): 5/12 cores positive - L lateral mid (20%, 3+3=6), L lateral base (50%, 3+3=6), R apex (10%, 3+3=6), R lateral apex (50%, 3+3=6), R lateral base (10%, 3+3=6), Vol 62.8  PSAD: 0.11   Surveillance:  Feb 2019: MRI - 1.3 cm PI-RADS 4 lesion at right  anterior apex  Apr 2019: MR fusion biopsy -     NON-GU PMH: GERD Personal history of colonic polyps    FAMILY HISTORY: 12 daughters - Runs in Family Hodgkin Disease - Daughter Prostate Cancer - Brother, Other   SOCIAL HISTORY: Marital Status: Married Preferred Language: English; Ethnicity: Not Hispanic Or Latino; Race: White Current Smoking Status: Patient has never smoked.  Social Drinker.  Drinks 1 caffeinated drink per day.    REVIEW OF SYSTEMS:    GU Review Male:   Patient denies frequent urination, hard to postpone urination, burning/ pain with urination, get up at night to urinate, leakage of urine, stream starts and stops, trouble starting your streams, and have to strain to urinate .  Gastrointestinal (Upper):   Patient denies nausea and vomiting.  Gastrointestinal (Lower):   Patient denies diarrhea and constipation.  Constitutional:   Patient denies fever, night sweats, weight loss, and fatigue.  Skin:   Patient denies skin rash/ lesion and itching.  Eyes:   Patient denies blurred vision and double vision.  Ears/ Nose/ Throat:   Patient denies sore throat and sinus problems.  Hematologic/Lymphatic:   Patient denies swollen glands and easy bruising.  Cardiovascular:   Patient denies leg swelling and chest pains.  Respiratory:   Patient denies cough and shortness of breath.  Endocrine:   Patient denies excessive thirst.  Musculoskeletal:   Patient denies back pain and joint pain.  Neurological:   Patient denies headaches and dizziness.  Psychologic:   Patient denies depression and anxiety.   VITAL SIGNS: None   MULTI-SYSTEM PHYSICAL EXAMINATION:    Constitutional: Well-nourished. No physical deformities. Normally developed. Good grooming.     PAST DATA REVIEWED:  Source Of History:  Patient  Lab Test Review:   PSA  Records Review:   Pathology Reports, Previous Patient Records  Urine Test Review:   Urinalysis   09/15/17 03/16/17 12/23/16 09/15/16 09/02/16 03/11/16  03/04/16 08/28/15  PSA  Total PSA 3.74 ng/mL 4.45 ng/mL 6.80 ng/mL 3.96 ng/dl 4.86 ng/dl 3.47 ng/dl 4.69 ng/dl 3.77 ng/dl  Free PSA  1.05 ng/mL 2.20 ng/mL       % Free PSA  24 % PSA 32 % PSA         PROCEDURES: None   ASSESSMENT:      ICD-10 Details  1 GU:   Prostate Cancer - C61    PLAN:           Document Letter(s):  Created for Patient: Clinical Summary         Notes:   1. Intermediate risk prostate cancer: I had a detailed discussion with Mr. Rout, his wife, and his daughter today regarding his recent biopsy results. He does have upgraded to Gleason 3+4=7 disease with all for MR targeted biopsies positive with relatively high volume. We discussed the implications of this finding today and how this impacts his recommendations for treatment. He continues to have an excellent life expectancy with minimal medical comorbidities. He has seen both Dr. Tammi Klippel and Dr.  Shadad earlier today.   The patient was counseled about the natural history of prostate cancer and the standard treatment options that are available for prostate cancer. It was explained to him how his age and life expectancy, clinical stage, Gleason score, and PSA affect his prognosis, the decision to proceed with additional staging studies, as well as how that information influences recommended treatment strategies. We discussed the roles for active surveillance, radiation therapy, surgical therapy, androgen deprivation, as well as ablative therapy options for the treatment of prostate cancer as appropriate to his individual cancer situation. We discussed the risks and benefits of these options with regard to their impact on cancer control and also in terms of potential adverse events, complications, and impact on quality of life particularly related to urinary and sexual function. The patient was encouraged to ask questions throughout the discussion today and all questions were answered to his stated satisfaction. In addition,  the patient was provided with and/or directed to appropriate resources and literature for further education about prostate cancer and treatment options.   After reviewing the pros and cons of treatment options and in particular both surgical therapy and primary radiation based therapy, he is interested in proceeding with a treatment of curative intent with radiation. He has been offered hypo fractionated IMRT for 5 and half weeks by Dr. Tammi Klippel. He has discuss the pros and cons of short-term androgen deprivation therapy and wishes to avoid this treatment. He will be scheduled for fiducial marker placement and SpaceOAR insertion in the near future with plans to proceed with IMRT.   CC: Dr. Wenda Low  Dr. Tyler Pita  Dr. Zola Button

## 2017-10-05 ENCOUNTER — Telehealth: Payer: Self-pay

## 2017-10-05 NOTE — Telephone Encounter (Signed)
Per 4/23 no los 

## 2017-10-06 NOTE — Progress Notes (Signed)
[place] Psychosocial Distress Screening Spiritual Care  Met with Banks, wife, and dtr Aldona Bar in [facility] to introduce [place] team/resources, reviewing distress screen per protocol.  The patient scored a 3 on the Psychosocial Distress Thermometer which indicates mild distress. Also assessed for distress and other psychosocial needs.   ONCBCN DISTRESS SCREENING 10/06/2017  Screening Type Initial Screening  Distress experienced in past week (1-10) 3  Emotional problem type Nervousness/Anxiety;Adjusting to illness  Physical Problem type Skin dry/itchy;Tingling hands/feet  Referral to support programs Yes   Pt anticipates that being able to take action to treat dx will help reduce distress. Family reports very strong mutual support due to multiple experiences with cancer: "We've made it through the other ones, so we'll find our way through this one, too."  Served as a witness to their family's stories of cancer dx/tx/distress/healing, providing empathic listening, emotional support, normalization of feelings, and encouragement to utilize [employer] team and programming.   Follow up needed: No. Per family, no other needs at this time, but please page if immediate needs arise or circumstances change. Thank you.   [place], [place], [facility] Pager [phone] Voicemail [phone]

## 2017-10-07 NOTE — Addendum Note (Signed)
Encounter addended by: Tyler Pita, MD on: 10/07/2017 5:49 PM  Actions taken: Visit Navigator Flowsheet section accepted

## 2017-10-07 NOTE — Addendum Note (Signed)
Encounter addended by: Tyler Pita, MD on: 10/07/2017 5:56 PM  Actions taken: Medication List reviewed, Allergies reviewed, Problem List reviewed, LOS modified

## 2017-10-11 ENCOUNTER — Telehealth: Payer: Self-pay | Admitting: Medical Oncology

## 2017-10-11 NOTE — Telephone Encounter (Signed)
Spoke with Mr. Larry Roberts as follow up to the [facility]. He states he is doing well and found clinic helpful. He voiced he has not heard from Dr. Lynne Logan office about an appointment for fiducial's and [place]. I informed him I will send Dr. Alinda Money an e-mail. He voiced understanding. He Is eager to begin his radiation treatments. Message to Dr. Alinda Money with above message.

## 2017-10-18 ENCOUNTER — Other Ambulatory Visit: Payer: Self-pay | Admitting: Urology

## 2017-10-18 ENCOUNTER — Telehealth: Payer: Self-pay | Admitting: *Deleted

## 2017-10-18 NOTE — Telephone Encounter (Signed)
CALLED PATIENT TO INFORM OF FID. MARKER AND SPACE OAR PLACEMENT ON 11-14-17 @ 1 PM @ [facility] AND HIS SIM ON 11-16-17, LVM FOR A RETURN CALL

## 2017-10-26 ENCOUNTER — Other Ambulatory Visit: Payer: Self-pay | Admitting: Urology

## 2017-10-27 ENCOUNTER — Other Ambulatory Visit: Payer: Self-pay | Admitting: Urology

## 2017-10-27 DIAGNOSIS — C61 Malignant neoplasm of prostate: Secondary | ICD-10-CM

## 2017-11-10 ENCOUNTER — Other Ambulatory Visit: Payer: Self-pay

## 2017-11-10 ENCOUNTER — Encounter (HOSPITAL_BASED_OUTPATIENT_CLINIC_OR_DEPARTMENT_OTHER): Payer: Self-pay | Admitting: *Deleted

## 2017-11-10 NOTE — Progress Notes (Signed)
Spoke w/ pt via phone for pre-op interview.  Npo after mn w/ exception clear liquids until 0700 (no cream/ milk products).  Arrive at 1100.  Needs istat and ekg.  Will take norvasc and zantac am dos w/ sips of water and do fleet enema.

## 2017-11-11 NOTE — H&P (Signed)
CC/HPI: CC: Prostate Cancer    Larry Roberts is a 77 year old gentleman who presented to me in the summer of 2018 for a persistently elevated PSA of 6.81 and right sided nodularity of the mid/base gland. He underwent a TRUS biopsy on 01/21/17 that demonstrated Gleason 3+3=6 adenocarcinoma of the prostate in 5 out of 12 biopsies. He initially delayed discussion and a decision about management of his prostate cancer due to the fact that his wife was diagnosed with breast cancer and began her treatment. He eventually returned to discuss his options in October 2018 and we agreed to proceed with an initial strategy of active surveillance. He underwent an MRI of the prostate in February 2019 that did demonstrate a 1.3 cm right anterior apical PI-RADS 4 lesion of the prostate. An MR/US fusion biopsy was then performed on 09/15/17 with targeted biopsies of his lesion and extended core sampling of the prostate. 8 out of 30 biopsies were positive including 4 out of 4 targeted biopsies with upgraded 3+4=7 adenocarcinoma. Interestingly, his PSA performed right before his recent biopsy was decreased to 3.74.   Family history: Brother   Imaging studies: MRI (07/26/17): No EPE, SVI, or LAD.   PMH: He has a history of GERD.  PSH: Appendectomy.   TNM stage: cT2 N0 Mx  PSA: 3.74  Gleason score: 3+4=7  Biopsy (09/15/17): 8/30 cores positive  Left: L apex (20% of one core, 3+3=6), L mid lateral (5% of one core, 3+3=6), L base lateral (5% of one core, 3+3=6)  Right: R apex lateral (40% of one core, 3+4=7)  MR targeted lesions (1.3 cm right anterior apical): 4/4 cores (70%, 50%, 50%, 50%, 3+4=7)  Prostate volume: 71.0 cc  PSAD: 0.05   Nomogram  OC disease: 56%  EPE: 45%  SVI: 1%  LNI: 1%  PFS (5 year, 10 year): 93%, 87%   Urinary function: IPSS is 9  Erectile function: SHIM score is 20.     ALLERGIES: Levaquin Tetanus    MEDICATIONS: Alprazolam 0.25 mg tablet  Amlodipine Besylate 5 mg tablet   Aspirin Ec 81 mg tablet, delayed release  Coq10  Dorzolamide Hcl 2 % drops  Osteo Bi-Flex  Rosuvastatin Calcium 10 mg tablet  Zantac 150 mg tablet     GU PSH: Prostate Needle Biopsy - 09/15/2017, 01/21/2017    NON-GU PSH: Appendectomy. Carpal Tunnel Surgery.., Right Colonoscopy & Polypectomy Shoulder Arthroscopy/surgery, Left Shoulder Surgery (Unspecified), Right Surgical Pathology, Gross And Microscopic Examination For Prostate Needle - 09/15/2017, 01/21/2017    GU PMH: Prostate Cancer - 03/22/2017 Elevated PSA - 03/11/2016      PMH Notes:   1) Elevated PSA: He initially presented to me in September 2017 with an elevated PSA of 4.69. His PSA was repeated and decreased to 3.47, which was stable from his baseline of 3.38 in 2013. We therefore elected to proceed with surveillance monitoring of his PSA considering his age and life expectancy. His PSA increased to 6.8 in August 2018 prompting a prostate biopsy. He was diagnosed with low risk prostate cancer and elected active surveillance.   Initial diagnosis: August 2018  Clinical stage: cT1c Nx Mx  PSA at diagnosis: 6.81  Biopsy (01/21/17): 5/12 cores positive - L lateral mid (20%, 3+3=6), L lateral base (50%, 3+3=6), R apex (10%, 3+3=6), R lateral apex (50%, 3+3=6), R lateral base (10%, 3+3=6), Vol 62.8  PSAD: 0.11   Surveillance:  Feb 2019: MRI - 1.3 cm PI-RADS 4 lesion at right anterior  apex  Apr 2019: MR fusion biopsy -     NON-GU PMH: GERD Personal history of colonic polyps    FAMILY HISTORY: 12 daughters - Runs in Family Hodgkin Disease - Daughter Prostate Cancer - Brother, Other   SOCIAL HISTORY: Marital Status: Married Preferred Language: English; Ethnicity: Not Hispanic Or Latino; Race: White Current Smoking Status: Patient has never smoked.  Social Drinker.  Drinks 1 caffeinated drink per day.    REVIEW OF SYSTEMS:    GU Review Male:   Patient denies frequent urination, hard to postpone urination, burning/ pain  with urination, get up at night to urinate, leakage of urine, stream starts and stops, trouble starting your streams, and have to strain to urinate .  Gastrointestinal (Upper):   Patient denies nausea and vomiting.  Gastrointestinal (Lower):   Patient denies diarrhea and constipation.  Constitutional:   Patient denies fever, night sweats, weight loss, and fatigue.  Skin:   Patient denies skin rash/ lesion and itching.  Eyes:   Patient denies blurred vision and double vision.  Ears/ Nose/ Throat:   Patient denies sore throat and sinus problems.  Hematologic/Lymphatic:   Patient denies swollen glands and easy bruising.  Cardiovascular:   Patient denies leg swelling and chest pains.  Respiratory:   Patient denies cough and shortness of breath.  Endocrine:   Patient denies excessive thirst.  Musculoskeletal:   Patient denies back pain and joint pain.  Neurological:   Patient denies headaches and dizziness.  Psychologic:   Patient denies depression and anxiety.     MULTI-SYSTEM PHYSICAL EXAMINATION:    Constitutional: Well-nourished. No physical deformities. Normally developed. Good grooming.    ASSESSMENT:      ICD-10 Details  1 GU:   Prostate Cancer - C61    PLAN:       1. Intermediate risk prostate cancer:   After reviewing the pros and cons of treatment options and in particular both surgical therapy and primary radiation based therapy, he is interested in proceeding with a treatment of curative intent with radiation. He has been offered hypo fractionated IMRT for 5 and half weeks by Dr. [name]. He has discuss the pros and cons of short-term androgen deprivation therapy and wishes to avoid this treatment. He will be scheduled for fiducial marker placement and SpaceOAR insertion in the near future with plans to proceed with IMRT.

## 2017-11-14 ENCOUNTER — Ambulatory Visit (HOSPITAL_BASED_OUTPATIENT_CLINIC_OR_DEPARTMENT_OTHER)
Admission: RE | Admit: 2017-11-14 | Discharge: 2017-11-14 | Disposition: A | Discharge status: Home / Self Care | Payer: Medicare Other | Source: Ambulatory Visit | Attending: Urology | Admitting: Urology

## 2017-11-14 ENCOUNTER — Ambulatory Visit (HOSPITAL_BASED_OUTPATIENT_CLINIC_OR_DEPARTMENT_OTHER): Payer: Medicare Other | Admitting: Anesthesiology

## 2017-11-14 ENCOUNTER — Encounter (HOSPITAL_BASED_OUTPATIENT_CLINIC_OR_DEPARTMENT_OTHER)
Admission: RE | Disposition: A | Discharge status: Home / Self Care | Payer: Self-pay | Source: Ambulatory Visit | Attending: Urology

## 2017-11-14 ENCOUNTER — Encounter (HOSPITAL_BASED_OUTPATIENT_CLINIC_OR_DEPARTMENT_OTHER): Payer: Self-pay

## 2017-11-14 DIAGNOSIS — Z87891 Personal history of nicotine dependence: Secondary | ICD-10-CM | POA: Diagnosis not present

## 2017-11-14 DIAGNOSIS — Z888 Allergy status to other drugs, medicaments and biological substances status: Secondary | ICD-10-CM | POA: Diagnosis not present

## 2017-11-14 DIAGNOSIS — Z807 Family history of other malignant neoplasms of lymphoid, hematopoietic and related tissues: Secondary | ICD-10-CM | POA: Diagnosis not present

## 2017-11-14 DIAGNOSIS — K219 Gastro-esophageal reflux disease without esophagitis: Secondary | ICD-10-CM | POA: Diagnosis not present

## 2017-11-14 DIAGNOSIS — C61 Malignant neoplasm of prostate: Secondary | ICD-10-CM | POA: Diagnosis not present

## 2017-11-14 DIAGNOSIS — Z887 Allergy status to serum and vaccine status: Secondary | ICD-10-CM | POA: Insufficient documentation

## 2017-11-14 DIAGNOSIS — Z8042 Family history of malignant neoplasm of prostate: Secondary | ICD-10-CM | POA: Insufficient documentation

## 2017-11-14 DIAGNOSIS — Z86010 Personal history of colonic polyps: Secondary | ICD-10-CM | POA: Diagnosis not present

## 2017-11-14 DIAGNOSIS — Z79899 Other long term (current) drug therapy: Secondary | ICD-10-CM | POA: Diagnosis not present

## 2017-11-14 DIAGNOSIS — Z7982 Long term (current) use of aspirin: Secondary | ICD-10-CM | POA: Insufficient documentation

## 2017-11-14 DIAGNOSIS — I10 Essential (primary) hypertension: Secondary | ICD-10-CM | POA: Insufficient documentation

## 2017-11-14 HISTORY — DX: Unspecified glaucoma: H40.9

## 2017-11-14 HISTORY — PX: GOLD SEED IMPLANT: SHX6343

## 2017-11-14 HISTORY — DX: Nocturia: R35.1

## 2017-11-14 HISTORY — PX: SPACE OAR INSTILLATION: SHX6769

## 2017-11-14 HISTORY — DX: Carpal tunnel syndrome, left upper limb: G56.02

## 2017-11-14 HISTORY — DX: Essential (primary) hypertension: I10

## 2017-11-14 HISTORY — DX: Unspecified osteoarthritis, unspecified site: M19.90

## 2017-11-14 LAB — POCT I-STAT 4, (NA,K, GLUC, HGB,HCT)
GLUCOSE: 104 mg/dL — AB (ref 65–99)
HCT: 44 % (ref 39.0–52.0)
HEMOGLOBIN: 15 g/dL (ref 13.0–17.0)
Potassium: 4 mmol/L (ref 3.5–5.1)
SODIUM: 143 mmol/L (ref 135–145)

## 2017-11-14 SURGERY — INSERTION, GOLD SEEDS
Anesthesia: General | Site: Prostate

## 2017-11-14 MED ORDER — PROPOFOL 10 MG/ML IV BOLUS
INTRAVENOUS | Status: AC
  Filled 2017-11-14: qty 40

## 2017-11-14 MED ORDER — CEFTRIAXONE SODIUM 2 G IJ SOLR
INTRAMUSCULAR | Status: AC
  Filled 2017-11-14: qty 20

## 2017-11-14 MED ORDER — METOCLOPRAMIDE HCL 5 MG/ML IJ SOLN
10.0000 mg | Freq: Once | INTRAMUSCULAR | Status: DC | PRN
  Filled 2017-11-14: qty 2

## 2017-11-14 MED ORDER — ONDANSETRON HCL 4 MG/2ML IJ SOLN
INTRAMUSCULAR | Status: AC
  Filled 2017-11-14: qty 2

## 2017-11-14 MED ORDER — SODIUM CHLORIDE 0.9 % IV SOLN
INTRAVENOUS | Status: DC
  Administered 2017-11-14: 11:00:00 via INTRAVENOUS
  Filled 2017-11-14: qty 1000

## 2017-11-14 MED ORDER — FENTANYL CITRATE (PF) 100 MCG/2ML IJ SOLN
25.0000 ug | INTRAMUSCULAR | Status: DC | PRN
  Filled 2017-11-14: qty 1

## 2017-11-14 MED ORDER — FENTANYL CITRATE (PF) 100 MCG/2ML IJ SOLN
INTRAMUSCULAR | Status: DC | PRN
  Administered 2017-11-14: 50 ug via INTRAVENOUS

## 2017-11-14 MED ORDER — DEXAMETHASONE SODIUM PHOSPHATE 10 MG/ML IJ SOLN
INTRAMUSCULAR | Status: DC | PRN
  Administered 2017-11-14: 10 mg via INTRAVENOUS

## 2017-11-14 MED ORDER — SODIUM CHLORIDE 0.9 % IV SOLN
2.0000 g | Freq: Once | INTRAVENOUS | Status: AC
  Administered 2017-11-14: 2 g via INTRAVENOUS
  Filled 2017-11-14: qty 20

## 2017-11-14 MED ORDER — ONDANSETRON HCL 4 MG/2ML IJ SOLN
INTRAMUSCULAR | Status: DC | PRN
  Administered 2017-11-14: 4 mg via INTRAVENOUS

## 2017-11-14 MED ORDER — LIDOCAINE 2% (20 MG/ML) 5 ML SYRINGE
INTRAMUSCULAR | Status: AC
  Filled 2017-11-14: qty 5

## 2017-11-14 MED ORDER — SODIUM CHLORIDE 0.9 % IV SOLN
INTRAVENOUS | Status: AC
  Filled 2017-11-14: qty 100

## 2017-11-14 MED ORDER — PROPOFOL 10 MG/ML IV BOLUS
INTRAVENOUS | Status: DC | PRN
  Administered 2017-11-14: 150 mg via INTRAVENOUS

## 2017-11-14 MED ORDER — LACTATED RINGERS IV SOLN
INTRAVENOUS | Status: DC
  Filled 2017-11-14: qty 1000

## 2017-11-14 MED ORDER — FLEET ENEMA 7-19 GM/118ML RE ENEM
1.0000 | ENEMA | Freq: Once | RECTAL | Status: AC
  Administered 2017-11-14: 1 via RECTAL
  Filled 2017-11-14: qty 1

## 2017-11-14 MED ORDER — MEPERIDINE HCL 25 MG/ML IJ SOLN
6.2500 mg | INTRAMUSCULAR | Status: DC | PRN
  Filled 2017-11-14: qty 1

## 2017-11-14 MED ORDER — DEXAMETHASONE SODIUM PHOSPHATE 10 MG/ML IJ SOLN
INTRAMUSCULAR | Status: AC
  Filled 2017-11-14: qty 1

## 2017-11-14 MED ORDER — FENTANYL CITRATE (PF) 100 MCG/2ML IJ SOLN
INTRAMUSCULAR | Status: AC
  Filled 2017-11-14: qty 2

## 2017-11-14 MED ORDER — LIDOCAINE 2% (20 MG/ML) 5 ML SYRINGE
INTRAMUSCULAR | Status: DC | PRN
  Administered 2017-11-14: 100 mg via INTRAVENOUS

## 2017-11-14 SURGICAL SUPPLY — 16 items
COVER TABLE BACK 60X90 (DRAPES) ×3 IMPLANT
DRSG TEGADERM 4X4.75 (GAUZE/BANDAGES/DRESSINGS) ×2 IMPLANT
DRSG TEGADERM 8X12 (GAUZE/BANDAGES/DRESSINGS) ×4 IMPLANT
GAUZE SPONGE 4X4 12PLY STRL LF (GAUZE/BANDAGES/DRESSINGS) ×2 IMPLANT
GLOVE BIO SURGEON STRL SZ7.5 (GLOVE) ×3 IMPLANT
GLOVE ECLIPSE 8.0 STRL XLNG CF (GLOVE) ×3 IMPLANT
GOWN STRL REUS W/TWL LRG LVL3 (GOWN DISPOSABLE) ×3 IMPLANT
GOWN STRL REUS W/TWL XL LVL3 (GOWN DISPOSABLE) ×3 IMPLANT
IMPL SPACEOAR SYSTEM 10ML (MISCELLANEOUS) ×1 IMPLANT
IMPLANT SPACEOAR SYSTEM 10ML (MISCELLANEOUS) ×3
KIT TURNOVER CYSTO (KITS) ×3 IMPLANT
MARKER GOLD PRELOAD 1.2X3 (Urological Implant) ×1 IMPLANT
PACK CYSTO (CUSTOM PROCEDURE TRAY) ×3 IMPLANT
SEED GOLD PRELOAD 1.2X3 (Urological Implant) ×9 IMPLANT
SURGILUBE 2OZ TUBE FLIPTOP (MISCELLANEOUS) ×3 IMPLANT
UNDERPAD 30X30 (UNDERPADS AND DIAPERS) ×6 IMPLANT

## 2017-11-14 NOTE — Op Note (Signed)
Preoperative diagnosis: Clinically localized adenocarcinoma of the prostate  Postoperative diagnosis: Clinically localized adenocarcinoma of the prostate  Procedure: 1) Transperineal placement of fiducial markers into prostate                    2) Insertion of SpaceOAR hydrogel   Surgeon: Pryor Curia. M.D.  Radiation Oncologist: Dr. Ledon Snare  Anesthesia: General  EBL: Minimal  Complications: None  Indication: Larry Roberts is a 78 y.o. gentleman with clinically localized prostate cancer. After discussing management options for treatment, he elected to proceed with radiotherapy. He presents today for the above procedures. The potential risks, complications, alternative options, and expected recovery course have been discussed in detail with the patient and he has provided informed consent to proceed.  Description of procedure: The patient was taken to the operating room and IV sedation was administered. He was administered preoperative antibiotics, placed in the dorsal lithotomy position, and prepped and draped in the usual sterile fashion. Next, intraoperative transrectal ultrasonography was utilized for real time imaging of the prostate. 3 fiducial markers were then placed into the prostate under direct transrectal ultrasound guidance using triangulation to place them at the left base, right mid and left apex.  A site in the midline was selected on the perineum for placement of an 18 g needle with saline.  The needle was advanced above the rectum and below Denonvillier's fascia to the mid gland and confirmed to be in the midline on transverse imaging.  One cc of saline was injected confirming appropriate expansion of this space.  A total of 5 cc of saline was then injected to open the space further bilaterally.  The saline syringe was then removed and the SpaceOAR hydrogel was injected with good distribution bilaterally. He tolerated the procedure well and without  complications. He was able to be transferred to the recovery unit in satisfactory condition.  He was given a voiding trial in the PACU.

## 2017-11-14 NOTE — Anesthesia Postprocedure Evaluation (Signed)
Anesthesia Post Note  Patient: Larry Roberts  Procedure(s) Performed: GOLD SEED IMPLANT (N/A Prostate) SPACE OAR INSTILLATION (N/A Prostate)     Patient location during evaluation: PACU Anesthesia Type: General Level of consciousness: awake and alert Pain management: pain level controlled Vital Signs Assessment: post-procedure vital signs reviewed and stable Respiratory status: spontaneous breathing, nonlabored ventilation, respiratory function stable and patient connected to nasal cannula oxygen Cardiovascular status: blood pressure returned to baseline and stable Postop Assessment: no apparent nausea or vomiting Anesthetic complications: no    Last Vitals:  Vitals:   11/14/17 1350 11/14/17 1400  BP: 125/86 (!) 163/84  Pulse: 64 (!) 59  Resp: 14 12  Temp: 36.8 C   SpO2: 100% 100%    Last Pain:  Vitals:   11/14/17 1350  TempSrc:   PainSc: 0-No pain                 Larry Roberts

## 2017-11-14 NOTE — Anesthesia Procedure Notes (Signed)
Procedure Name: LMA Insertion Date/Time: 11/14/2017 1:25 PM Performed by: Bonney Aid, CRNA Pre-anesthesia Checklist: Patient identified, Emergency Drugs available, Suction available and Patient being monitored Patient Re-evaluated:Patient Re-evaluated prior to induction Oxygen Delivery Method: Circle system utilized Preoxygenation: Pre-oxygenation with 100% oxygen Induction Type: IV induction Ventilation: Mask ventilation without difficulty LMA: LMA inserted LMA Size: 5.0 Number of attempts: 1 Airway Equipment and Method: Bite block Placement Confirmation: positive ETCO2 Tube secured with: Tape Dental Injury: Teeth and Oropharynx as per pre-operative assessment

## 2017-11-14 NOTE — Transfer of Care (Signed)
Immediate Anesthesia Transfer of Care Note  Patient: Larry Roberts  Procedure(s) Performed: GOLD SEED IMPLANT (N/A Prostate) SPACE OAR INSTILLATION (N/A Prostate)  Patient Location: PACU  Anesthesia Type:General  Level of Consciousness: awake, alert  and oriented  Airway & Oxygen Therapy: Patient Spontanous Breathing and Patient connected to nasal cannula oxygen  Post-op Assessment: Report given to RN  Post vital signs: Reviewed and stable  Last Vitals: 125/86, 65, 13, 99%, 98.2 Vitals Value Taken Time  BP    Temp    Pulse    Resp    SpO2      Last Pain:  Vitals:   11/14/17 1118  TempSrc:   PainSc: 0-No pain      Patients Stated Pain Goal: 4 (67/20/94 [id number])  Complications: No apparent anesthesia complications

## 2017-11-14 NOTE — Discharge Instructions (Addendum)
Call if fever > 101 or pain not controlled with Tylenol.  Call your surgeon if you experience:   1.  Fever over 101.0. 2.  Inability to urinate. 3.  Nausea and/or vomiting. 4.  Extreme swelling or bruising at the surgical site. 5.  Continued bleeding from the incision. 6.  Increased pain, redness or drainage from the incision. 7.  Problems related to your pain medication. 8.  Any problems and/or concerns    Post Anesthesia Home Care Instructions  Activity: Get plenty of rest for the remainder of the day. A responsible individual must stay with you for 24 hours following the procedure.  For the next 24 hours, DO NOT: -Drive a car -Paediatric nurse -Drink alcoholic beverages -Take any medication unless instructed by your physician -Make any legal decisions or sign important papers.  Meals: Start with liquid foods such as gelatin or soup. Progress to regular foods as tolerated. Avoid greasy, spicy, heavy foods. If nausea and/or vomiting occur, drink only clear liquids until the nausea and/or vomiting subsides. Call your physician if vomiting continues.  Special Instructions/Symptoms: Your throat may feel dry or sore from the anesthesia or the breathing tube placed in your throat during surgery. If this causes discomfort, gargle with warm salt water. The discomfort should disappear within 24 hours.  If you had a scopolamine patch placed behind your ear for the management of post- operative nausea and/or vomiting:  1. The medication in the patch is effective for 72 hours, after which it should be removed.  Wrap patch in a tissue and discard in the trash. Wash hands thoroughly with soap and water. 2. You may remove the patch earlier than 72 hours if you experience unpleasant side effects which may include dry mouth, dizziness or visual disturbances. 3. Avoid touching the patch. Wash your hands with soap and water after contact with the patch.

## 2017-11-14 NOTE — Anesthesia Preprocedure Evaluation (Signed)
Anesthesia Evaluation  Patient identified by MRN, date of birth, ID band Patient awake    Reviewed: Allergy & Precautions, NPO status , Patient's Chart, lab work & pertinent test results  Airway Mallampati: II  TM Distance: >3 FB Neck ROM: Full    Dental no notable dental hx. (+) Caps   Pulmonary neg pulmonary ROS, former smoker,    Pulmonary exam normal breath sounds clear to auscultation       Cardiovascular hypertension, Pt. on medications Normal cardiovascular exam Rhythm:Regular Rate:Normal     Neuro/Psych negative neurological ROS  negative psych ROS   GI/Hepatic Neg liver ROS, GERD  Medicated and Controlled,  Endo/Other  negative endocrine ROS  Renal/GU negative Renal ROS  negative genitourinary   Musculoskeletal negative musculoskeletal ROS (+)   Abdominal   Peds negative pediatric ROS (+)  Hematology negative hematology ROS (+)   Anesthesia Other Findings   Reproductive/Obstetrics negative OB ROS                             Anesthesia Physical Anesthesia Plan  ASA: II  Anesthesia Plan: General   Post-op Pain Management:    Induction: Intravenous  PONV Risk Score and Plan: 2 and Ondansetron and Treatment may vary due to age or medical condition  Airway Management Planned: LMA  Additional Equipment:   Intra-op Plan:   Post-operative Plan: Extubation in OR  Informed Consent: I have reviewed the patients History and Physical, chart, labs and discussed the procedure including the risks, benefits and alternatives for the proposed anesthesia with the patient or authorized representative who has indicated his/her understanding and acceptance.   Dental advisory given  Plan Discussed with: CRNA  Anesthesia Plan Comments:         Anesthesia Quick Evaluation

## 2017-11-15 ENCOUNTER — Encounter (HOSPITAL_BASED_OUTPATIENT_CLINIC_OR_DEPARTMENT_OTHER): Payer: Self-pay | Admitting: Urology

## 2017-11-16 ENCOUNTER — Ambulatory Visit
Admission: RE | Admit: 2017-11-16 | Discharge: 2017-11-16 | Disposition: A | Discharge status: Home / Self Care | Payer: Medicare Other | Source: Ambulatory Visit | Attending: Radiation Oncology | Admitting: Radiation Oncology

## 2017-11-16 DIAGNOSIS — C61 Malignant neoplasm of prostate: Secondary | ICD-10-CM

## 2017-11-16 DIAGNOSIS — Z510 Encounter for antineoplastic radiation therapy: Secondary | ICD-10-CM | POA: Diagnosis present

## 2017-11-16 NOTE — Progress Notes (Signed)
  Radiation Oncology         [phone] ________________________________  Name: Larry Roberts MRN: [id number]  Date: 11/16/2017  DOB: Apr 21, 1941  SIMULATION AND TREATMENT PLANNING NOTE    ICD-10-CM   1. Malignant neoplasm of prostate ([place]) C61     DIAGNOSIS:  77 y.o. gentleman with stage T2a adenocarcinoma of the prostate with a Gleason's score of 3+4 and a PSA of 3.74  NARRATIVE:  The patient was brought to the [facility].  Identity was confirmed.  All relevant records and images related to the planned course of therapy were reviewed.  The patient freely provided informed written consent to proceed with treatment after reviewing the details related to the planned course of therapy. The consent form was witnessed and verified by the simulation staff.  Then, the patient was set-up in a stable reproducible supine position for radiation therapy.  A vacuum lock pillow device was custom fabricated to position his legs in a reproducible immobilized position.  Then, I performed a urethrogram under sterile conditions to identify the prostatic apex.  CT images were obtained.  Surface markings were placed.  The CT images were loaded into the planning software.  Then the prostate target and avoidance structures including the rectum, bladder, bowel and hips were contoured.  Treatment planning then occurred.  The radiation prescription was entered and confirmed.  A total of one complex treatment devices was fabricated. I have requested : Intensity Modulated Radiotherapy (IMRT) is medically necessary for this case for the following reason:  Rectal sparing.Marland Kitchen  PLAN:  The patient will receive 70 Gy in 28 fractions.  ________________________________  Sheral Apley Tammi Klippel, M.D.

## 2017-11-19 ENCOUNTER — Ambulatory Visit (HOSPITAL_COMMUNITY)
Admission: RE | Admit: 2017-11-19 | Discharge: 2017-11-19 | Disposition: A | Discharge status: Home / Self Care | Payer: Medicare Other | Source: Ambulatory Visit | Attending: Urology | Admitting: Urology

## 2017-11-19 DIAGNOSIS — C61 Malignant neoplasm of prostate: Secondary | ICD-10-CM | POA: Insufficient documentation

## 2017-11-25 DIAGNOSIS — Z510 Encounter for antineoplastic radiation therapy: Secondary | ICD-10-CM | POA: Diagnosis not present

## 2017-11-28 ENCOUNTER — Ambulatory Visit
Admission: RE | Admit: 2017-11-28 | Discharge: 2017-11-28 | Disposition: A | Discharge status: Home / Self Care | Payer: Medicare Other | Source: Ambulatory Visit | Attending: Radiation Oncology | Admitting: Radiation Oncology

## 2017-11-28 ENCOUNTER — Encounter: Payer: Self-pay | Admitting: Medical Oncology

## 2017-11-28 DIAGNOSIS — Z510 Encounter for antineoplastic radiation therapy: Secondary | ICD-10-CM | POA: Diagnosis not present

## 2017-11-29 ENCOUNTER — Ambulatory Visit
Admission: RE | Admit: 2017-11-29 | Discharge: 2017-11-29 | Disposition: A | Discharge status: Home / Self Care | Payer: Medicare Other | Source: Ambulatory Visit | Attending: Radiation Oncology | Admitting: Radiation Oncology

## 2017-11-29 DIAGNOSIS — Z510 Encounter for antineoplastic radiation therapy: Secondary | ICD-10-CM | POA: Diagnosis not present

## 2017-11-30 ENCOUNTER — Ambulatory Visit
Admission: RE | Admit: 2017-11-30 | Discharge: 2017-11-30 | Disposition: A | Discharge status: Home / Self Care | Payer: Medicare Other | Source: Ambulatory Visit | Attending: Radiation Oncology | Admitting: Radiation Oncology

## 2017-11-30 DIAGNOSIS — Z510 Encounter for antineoplastic radiation therapy: Secondary | ICD-10-CM | POA: Diagnosis not present

## 2017-12-01 ENCOUNTER — Ambulatory Visit
Admission: RE | Admit: 2017-12-01 | Discharge: 2017-12-01 | Disposition: A | Discharge status: Home / Self Care | Payer: Medicare Other | Source: Ambulatory Visit | Attending: Radiation Oncology | Admitting: Radiation Oncology

## 2017-12-01 DIAGNOSIS — Z510 Encounter for antineoplastic radiation therapy: Secondary | ICD-10-CM | POA: Diagnosis not present

## 2017-12-02 ENCOUNTER — Ambulatory Visit
Admission: RE | Admit: 2017-12-02 | Discharge: 2017-12-02 | Disposition: A | Discharge status: Home / Self Care | Payer: Medicare Other | Source: Ambulatory Visit | Attending: Radiation Oncology | Admitting: Radiation Oncology

## 2017-12-02 DIAGNOSIS — Z510 Encounter for antineoplastic radiation therapy: Secondary | ICD-10-CM | POA: Diagnosis not present

## 2017-12-05 ENCOUNTER — Ambulatory Visit
Admission: RE | Admit: 2017-12-05 | Discharge: 2017-12-05 | Disposition: A | Discharge status: Home / Self Care | Payer: Medicare Other | Source: Ambulatory Visit | Attending: Radiation Oncology | Admitting: Radiation Oncology

## 2017-12-05 DIAGNOSIS — Z510 Encounter for antineoplastic radiation therapy: Secondary | ICD-10-CM | POA: Diagnosis not present

## 2017-12-06 ENCOUNTER — Telehealth: Payer: Self-pay | Admitting: Urology

## 2017-12-06 ENCOUNTER — Ambulatory Visit
Admission: RE | Admit: 2017-12-06 | Discharge: 2017-12-06 | Disposition: A | Discharge status: Home / Self Care | Payer: Medicare Other | Source: Ambulatory Visit | Attending: Radiation Oncology | Admitting: Radiation Oncology

## 2017-12-06 ENCOUNTER — Encounter: Payer: Self-pay | Admitting: Radiation Oncology

## 2017-12-06 DIAGNOSIS — Z510 Encounter for antineoplastic radiation therapy: Secondary | ICD-10-CM | POA: Diagnosis not present

## 2017-12-06 NOTE — Telephone Encounter (Signed)
Finally connected with this patient to confirm that he does indeed have a long-standing history of a left renal cyst that was previously monitored with his urologist in [place], [place].  He reports being aware of this cyst for at least 15 years with surveillance imaging q 2 years.  We recently became aware of the mass on his simulation CT scan for treatment planning (Hounsfield units were around 0 suggesting a large cyst) and wanted to ensure that he was aware of this finding and was having appropriate follow-up which appears to be the case.  He is advised to continue in follow-up with Dr. Alinda Money to determine if and when any further follow-up imaging is necessary.  He states his understanding and compliance.  He is currently tolerating his prostate IMRT well.  He knows to call with any questions or concerns at any time.    Nicholos Johns, PA-C

## 2017-12-07 ENCOUNTER — Ambulatory Visit
Admission: RE | Admit: 2017-12-07 | Discharge: 2017-12-07 | Disposition: A | Discharge status: Home / Self Care | Payer: Medicare Other | Source: Ambulatory Visit | Attending: Radiation Oncology | Admitting: Radiation Oncology

## 2017-12-07 DIAGNOSIS — Z510 Encounter for antineoplastic radiation therapy: Secondary | ICD-10-CM | POA: Diagnosis not present

## 2017-12-08 ENCOUNTER — Ambulatory Visit
Admission: RE | Admit: 2017-12-08 | Discharge: 2017-12-08 | Disposition: A | Discharge status: Home / Self Care | Payer: Medicare Other | Source: Ambulatory Visit | Attending: Radiation Oncology | Admitting: Radiation Oncology

## 2017-12-08 DIAGNOSIS — Z510 Encounter for antineoplastic radiation therapy: Secondary | ICD-10-CM | POA: Diagnosis not present

## 2017-12-09 ENCOUNTER — Ambulatory Visit
Admission: RE | Admit: 2017-12-09 | Discharge: 2017-12-09 | Disposition: A | Discharge status: Home / Self Care | Payer: Medicare Other | Source: Ambulatory Visit | Attending: Radiation Oncology | Admitting: Radiation Oncology

## 2017-12-09 DIAGNOSIS — Z510 Encounter for antineoplastic radiation therapy: Secondary | ICD-10-CM | POA: Diagnosis not present

## 2017-12-12 ENCOUNTER — Ambulatory Visit
Admission: RE | Admit: 2017-12-12 | Discharge: 2017-12-12 | Disposition: A | Discharge status: Home / Self Care | Payer: Medicare Other | Source: Ambulatory Visit | Attending: Radiation Oncology | Admitting: Radiation Oncology

## 2017-12-12 DIAGNOSIS — Z510 Encounter for antineoplastic radiation therapy: Secondary | ICD-10-CM | POA: Diagnosis present

## 2017-12-12 DIAGNOSIS — C61 Malignant neoplasm of prostate: Secondary | ICD-10-CM | POA: Diagnosis not present

## 2017-12-13 ENCOUNTER — Ambulatory Visit
Admission: RE | Admit: 2017-12-13 | Discharge: 2017-12-13 | Disposition: A | Discharge status: Home / Self Care | Payer: Medicare Other | Source: Ambulatory Visit | Attending: Radiation Oncology | Admitting: Radiation Oncology

## 2017-12-13 DIAGNOSIS — Z510 Encounter for antineoplastic radiation therapy: Secondary | ICD-10-CM | POA: Diagnosis not present

## 2017-12-14 ENCOUNTER — Ambulatory Visit
Admission: RE | Admit: 2017-12-14 | Discharge: 2017-12-14 | Disposition: A | Discharge status: Home / Self Care | Payer: Medicare Other | Source: Ambulatory Visit | Attending: Radiation Oncology | Admitting: Radiation Oncology

## 2017-12-14 DIAGNOSIS — Z510 Encounter for antineoplastic radiation therapy: Secondary | ICD-10-CM | POA: Diagnosis not present

## 2017-12-16 ENCOUNTER — Ambulatory Visit
Admission: RE | Admit: 2017-12-16 | Discharge: 2017-12-16 | Disposition: A | Discharge status: Home / Self Care | Payer: Medicare Other | Source: Ambulatory Visit | Attending: Radiation Oncology | Admitting: Radiation Oncology

## 2017-12-16 DIAGNOSIS — Z510 Encounter for antineoplastic radiation therapy: Secondary | ICD-10-CM | POA: Diagnosis not present

## 2017-12-19 ENCOUNTER — Ambulatory Visit
Admission: RE | Admit: 2017-12-19 | Discharge: 2017-12-19 | Disposition: A | Discharge status: Home / Self Care | Payer: Medicare Other | Source: Ambulatory Visit | Attending: Radiation Oncology | Admitting: Radiation Oncology

## 2017-12-19 DIAGNOSIS — Z510 Encounter for antineoplastic radiation therapy: Secondary | ICD-10-CM | POA: Diagnosis not present

## 2017-12-20 ENCOUNTER — Ambulatory Visit
Admission: RE | Admit: 2017-12-20 | Discharge: 2017-12-20 | Disposition: A | Discharge status: Home / Self Care | Payer: Medicare Other | Source: Ambulatory Visit | Attending: Radiation Oncology | Admitting: Radiation Oncology

## 2017-12-20 DIAGNOSIS — Z510 Encounter for antineoplastic radiation therapy: Secondary | ICD-10-CM | POA: Diagnosis not present

## 2017-12-21 ENCOUNTER — Ambulatory Visit
Admission: RE | Admit: 2017-12-21 | Discharge: 2017-12-21 | Disposition: A | Discharge status: Home / Self Care | Payer: Medicare Other | Source: Ambulatory Visit | Attending: Radiation Oncology | Admitting: Radiation Oncology

## 2017-12-21 DIAGNOSIS — Z510 Encounter for antineoplastic radiation therapy: Secondary | ICD-10-CM | POA: Diagnosis not present

## 2017-12-22 ENCOUNTER — Ambulatory Visit
Admission: RE | Admit: 2017-12-22 | Discharge: 2017-12-22 | Disposition: A | Discharge status: Home / Self Care | Payer: Medicare Other | Source: Ambulatory Visit | Attending: Radiation Oncology | Admitting: Radiation Oncology

## 2017-12-22 DIAGNOSIS — Z510 Encounter for antineoplastic radiation therapy: Secondary | ICD-10-CM | POA: Diagnosis not present

## 2017-12-23 ENCOUNTER — Ambulatory Visit: Payer: Medicare Other

## 2017-12-26 ENCOUNTER — Other Ambulatory Visit: Payer: Self-pay | Admitting: Radiation Oncology

## 2017-12-26 ENCOUNTER — Ambulatory Visit
Admission: RE | Admit: 2017-12-26 | Discharge: 2017-12-26 | Disposition: A | Discharge status: Home / Self Care | Payer: Medicare Other | Source: Ambulatory Visit | Attending: Radiation Oncology | Admitting: Radiation Oncology

## 2017-12-26 DIAGNOSIS — Z510 Encounter for antineoplastic radiation therapy: Secondary | ICD-10-CM | POA: Diagnosis not present

## 2017-12-26 MED ORDER — TAMSULOSIN HCL 0.4 MG PO CAPS
0.4000 mg | ORAL_CAPSULE | Freq: Every day | ORAL | 5 refills | Status: DC
Start: 2017-12-26 — End: 2018-10-09

## 2017-12-27 ENCOUNTER — Ambulatory Visit
Admission: RE | Admit: 2017-12-27 | Discharge: 2017-12-27 | Disposition: A | Discharge status: Home / Self Care | Payer: Medicare Other | Source: Ambulatory Visit | Attending: Radiation Oncology | Admitting: Radiation Oncology

## 2017-12-27 DIAGNOSIS — Z510 Encounter for antineoplastic radiation therapy: Secondary | ICD-10-CM | POA: Diagnosis not present

## 2017-12-28 ENCOUNTER — Ambulatory Visit
Admission: RE | Admit: 2017-12-28 | Discharge: 2017-12-28 | Disposition: A | Discharge status: Home / Self Care | Payer: Medicare Other | Source: Ambulatory Visit | Attending: Radiation Oncology | Admitting: Radiation Oncology

## 2017-12-28 DIAGNOSIS — Z510 Encounter for antineoplastic radiation therapy: Secondary | ICD-10-CM | POA: Diagnosis not present

## 2017-12-29 ENCOUNTER — Ambulatory Visit
Admission: RE | Admit: 2017-12-29 | Discharge: 2017-12-29 | Disposition: A | Discharge status: Home / Self Care | Payer: Medicare Other | Source: Ambulatory Visit | Attending: Radiation Oncology | Admitting: Radiation Oncology

## 2017-12-29 DIAGNOSIS — Z510 Encounter for antineoplastic radiation therapy: Secondary | ICD-10-CM | POA: Diagnosis not present

## 2017-12-30 ENCOUNTER — Ambulatory Visit
Admission: RE | Admit: 2017-12-30 | Discharge: 2017-12-30 | Disposition: A | Discharge status: Home / Self Care | Payer: Medicare Other | Source: Ambulatory Visit | Attending: Radiation Oncology | Admitting: Radiation Oncology

## 2017-12-30 DIAGNOSIS — Z510 Encounter for antineoplastic radiation therapy: Secondary | ICD-10-CM | POA: Diagnosis not present

## 2018-01-02 ENCOUNTER — Ambulatory Visit
Admission: RE | Admit: 2018-01-02 | Discharge: 2018-01-02 | Disposition: A | Discharge status: Home / Self Care | Payer: Medicare Other | Source: Ambulatory Visit | Attending: Radiation Oncology | Admitting: Radiation Oncology

## 2018-01-02 DIAGNOSIS — Z510 Encounter for antineoplastic radiation therapy: Secondary | ICD-10-CM | POA: Diagnosis not present

## 2018-01-03 ENCOUNTER — Ambulatory Visit
Admission: RE | Admit: 2018-01-03 | Discharge: 2018-01-03 | Disposition: A | Discharge status: Home / Self Care | Payer: Medicare Other | Source: Ambulatory Visit | Attending: Radiation Oncology | Admitting: Radiation Oncology

## 2018-01-03 DIAGNOSIS — Z510 Encounter for antineoplastic radiation therapy: Secondary | ICD-10-CM | POA: Diagnosis not present

## 2018-01-04 ENCOUNTER — Ambulatory Visit
Admission: RE | Admit: 2018-01-04 | Discharge: 2018-01-04 | Disposition: A | Discharge status: Home / Self Care | Payer: Medicare Other | Source: Ambulatory Visit | Attending: Radiation Oncology | Admitting: Radiation Oncology

## 2018-01-04 DIAGNOSIS — Z510 Encounter for antineoplastic radiation therapy: Secondary | ICD-10-CM | POA: Diagnosis not present

## 2018-01-05 ENCOUNTER — Ambulatory Visit: Payer: Medicare Other

## 2018-01-05 ENCOUNTER — Ambulatory Visit
Admission: RE | Admit: 2018-01-05 | Discharge: 2018-01-05 | Disposition: A | Discharge status: Home / Self Care | Payer: Medicare Other | Source: Ambulatory Visit | Attending: Radiation Oncology | Admitting: Radiation Oncology

## 2018-01-05 DIAGNOSIS — Z510 Encounter for antineoplastic radiation therapy: Secondary | ICD-10-CM | POA: Diagnosis not present

## 2018-01-06 ENCOUNTER — Encounter: Payer: Self-pay | Admitting: Radiation Oncology

## 2018-01-06 ENCOUNTER — Ambulatory Visit
Admission: RE | Admit: 2018-01-06 | Discharge: 2018-01-06 | Disposition: A | Discharge status: Home / Self Care | Payer: Medicare Other | Source: Ambulatory Visit | Attending: Radiation Oncology | Admitting: Radiation Oncology

## 2018-01-06 ENCOUNTER — Encounter: Payer: Self-pay | Admitting: Medical Oncology

## 2018-01-06 DIAGNOSIS — Z510 Encounter for antineoplastic radiation therapy: Secondary | ICD-10-CM | POA: Diagnosis not present

## 2018-01-06 NOTE — Progress Notes (Signed)
  Radiation Oncology         [phone] ________________________________  Name: Larry Roberts MRN: [id number]  Date: 01/06/2018  DOB: [birth year]  End of Treatment Note  Diagnosis:   77 y.o. gentleman with stage T2a adenocarcinoma of the prostate with a gleason score of 3+4 and a PSA of 3.74.     Indication for treatment: Curative  Radiation treatment dates:    11/28/17 - 01/06/18       Site/dose:   70 Gy directed to the prostate delivered in 28 fractions  Beams/energy:   6X // Photon using IMRT technique.   Narrative: The patient tolerated radiation treatment relatively well.   Patient denies having pain, dysuria or hematuria. Patient continues to take on tab of Flomax with great success. He denies having diarrhea, fatigue, incontinece or urinary leakage. Patient reports a steady urine stream withour difficulty emptying his bladder.  Plan: The patient has completed radiation treatment. The patient will return to radiation oncology clinic for routine followup in one month. I advised him to call or return sooner if he has any questions or concerns related to his recovery or treatment. ________________________________  Sheral Apley. Tammi Klippel, M.D.  This document serves as a record of services personally performed by Tyler Pita MD. It was created on his behalf by Delton Coombes, a trained medical scribe. The creation of this record is based on the scribe's personal observations and the provider's statements to them.

## 2018-01-06 NOTE — Progress Notes (Signed)
Celebrated with Mr. Larry Roberts as he completed radiation today. He has follow up 8/28 with [name]. I asked him to call with questions or concerns.

## 2018-01-11 ENCOUNTER — Encounter: Payer: Self-pay | Admitting: Radiation Oncology

## 2018-01-11 NOTE — Progress Notes (Signed)
  Radiation Oncology         [phone] ________________________________  Name: ANTOINNE SPADACCINI MRN: [id number]  Date: 01/11/2018  DOB: Aug 06, 1940  End of Treatment Note  Diagnosis:  Stage T2a adenocarcinoma of the prostate with a Gleason's score of 3+4 and a PSA of 3.74  Indication for treatment:  Curative, Definitive Radiotherapy       Radiation treatment dates:   11/28/17 - 01/06/18  Site/dose:   The prostate was treated to 70 Gy in 28 fractions of 2.5 Gy  Beams/energy:   The patient was treated with IMRT using volumetric arc therapy delivering 6 MV X-rays to clockwise and counterclockwise circumferential arcs with a 90 degree collimator offset to avoid dose scalloping.  Image guidance was performed with daily cone beam CT prior to each fraction to align to gold markers in the prostate and assure proper bladder and rectal fill volumes.  Immobilization was achieved with BodyFix custom mold.  Narrative: The patient tolerated radiation treatment relatively well. He denied experiencing fatigue, diarrhea, pain, and hematuria throughout. By the end of treatment, he reported resolved dysuria, rare dysuria, and resolved issues emptying his bladder and with urine stream.  Plan: The patient has completed radiation treatment. He will return to radiation oncology clinic for routine followup in one month. I advised him to call or return sooner if he has any questions or concerns related to his recovery or treatment. ________________________________  Sheral Apley. Tammi Klippel, M.D.  This document serves as a record of services personally performed by Tyler Pita, MD. It was created on his behalf by Wilburn Mylar, a trained medical scribe. The creation of this record is based on the scribe's personal observations and the provider's statements to them. This document has been checked and approved by the attending provider.

## 2018-02-08 ENCOUNTER — Encounter: Payer: Self-pay | Admitting: Urology

## 2018-02-08 ENCOUNTER — Other Ambulatory Visit: Payer: Self-pay

## 2018-02-08 ENCOUNTER — Ambulatory Visit
Admission: RE | Admit: 2018-02-08 | Discharge: 2018-02-08 | Disposition: A | Discharge status: Home / Self Care | Payer: Medicare Other | Source: Ambulatory Visit | Attending: Urology | Admitting: Urology

## 2018-02-08 VITALS — BP 136/66 | HR 58 | Temp 97.8°F | Resp 20 | Ht 70.0 in | Wt 191.6 lb

## 2018-02-08 DIAGNOSIS — Z923 Personal history of irradiation: Secondary | ICD-10-CM | POA: Diagnosis not present

## 2018-02-08 DIAGNOSIS — Z79899 Other long term (current) drug therapy: Secondary | ICD-10-CM | POA: Diagnosis not present

## 2018-02-08 DIAGNOSIS — C61 Malignant neoplasm of prostate: Secondary | ICD-10-CM | POA: Insufficient documentation

## 2018-02-08 DIAGNOSIS — Z881 Allergy status to other antibiotic agents status: Secondary | ICD-10-CM | POA: Diagnosis not present

## 2018-02-08 NOTE — Progress Notes (Signed)
Radiation Oncology         [phone] ________________________________  Name: IGOR BISHOP MRN: [id number]  Date: 02/08/2018  DOB: 02/18/1941  Post Treatment Note  CC: Wenda Low, MD  Raynelle Bring, MD  Diagnosis:   Stage T2a adenocarcinoma of the prostate with a Gleason's score of 3+4 and a PSA of 3.74  Interval Since Last Radiation:  4 weeks  11/28/17 - 01/06/18:   The prostate was treated to 70 Gy in 28 fractions of 2.5 Gy  Narrative:  The patient returns today for routine follow-up.  He tolerated radiation treatment relatively well. He denied experiencing fatigue, diarrhea, pain, and hematuria throughout. By the end of treatment, he reported resolved dysuria, rare dysuria, and resolved issues emptying his bladder and with improved urine stream.                              On review of systems, the patient states that he is doing very well overall and is currently without complaints.  His current IPSS score is 8 indicating mild urinary symptoms only.  He specifically denies dysuria, gross hematuria, excessive daytime frequency, weak stream, intermittency, incomplete emptying or incontinence.  He continues with mild increased urgency and nocturia 4-5 times per night.  He continues taking Flomax nightly.  He reports a healthy appetite and is maintaining his weight.  He denies abdominal pain, nausea, vomiting or diarrhea.  His bowel habits have returned to normal.  He denies any significant fatigue.  Overall, he is quite pleased with his progress to date.  ALLERGIES:  is allergic to levaquin [levofloxacin] and tetanus toxoids.  Meds: Current Outpatient Medications  Medication Sig Dispense Refill  . ALPRAZolam (XANAX) 0.5 MG tablet Take 0.5 mg by mouth as needed.   0  . amLODipine (NORVASC) 5 MG tablet Take 5 mg by mouth every morning.   0  . Coenzyme Q10 (CO Q 10 PO) Take 1 capsule by mouth daily.     . dorzolamide-timolol (COSOPT) 22.3-6.8 MG/ML ophthalmic solution Place 1  drop into both eyes 2 (two) times daily.   6  . latanoprost (XALATAN) 0.005 % ophthalmic solution INSTILL 1 DROP INTO BOTH EYES NIGHTLY  2  . Misc Natural Products (OSTEO BI-FLEX JOINT SHIELD) TABS Take 2 tablets by mouth daily.    . ranitidine (ZANTAC) 150 MG tablet Take 150 mg by mouth as needed for heartburn.    . rosuvastatin (CRESTOR) 5 MG tablet Take 5 mg by mouth at bedtime.     . tamsulosin (FLOMAX) 0.4 MG CAPS capsule Take 1 capsule (0.4 mg total) by mouth daily after supper. 30 capsule 5   No current facility-administered medications for this encounter.     Physical Findings:  height is 5\' 10"  (1.778 m) and weight is 191 lb 9.6 oz (86.9 kg). His oral temperature is 97.8 F (36.6 C). His blood pressure is 136/66 and his pulse is 58 (abnormal). His respiration is 20 and oxygen saturation is 99%.  Pain Assessment Pain Score: 0-No pain/10 In general this is a well appearing Caucasian male in no acute distress.  He's alert and oriented x4 and appropriate throughout the examination. Cardiopulmonary assessment is negative for acute distress and he exhibits normal effort.   Lab Findings: Lab Results  Component Value Date   WBC 4.4 05/15/2009   HGB 15.0 11/14/2017   HCT 44.0 11/14/2017   MCV 89.4 05/15/2009   PLT 186 05/15/2009  Radiographic Findings: No results found.  Impression/Plan: 1. Stage T2a adenocarcinoma of the prostate with a Gleason's score of 3+4 and a PSA of 3.74 He will continue to follow up with urology for ongoing PSA determinations and has an appointment scheduled with Dr. Alinda Money  on 05/17/18. He understands what to expect with regards to PSA monitoring going forward. I will look forward to following his response to treatment via correspondence with urology, and would be happy to continue to participate in his care if clinically indicated. I talked to the patient about what to expect in the future, including his risk for erectile dysfunction and rectal bleeding.  I encouraged him to call or return to the office if he has any questions regarding his previous radiation or possible radiation side effects. He was comfortable with this plan and will follow up as needed.    Nicholos Johns, PA-C

## 2018-08-17 ENCOUNTER — Other Ambulatory Visit: Payer: Self-pay | Admitting: Internal Medicine

## 2018-08-17 ENCOUNTER — Other Ambulatory Visit (HOSPITAL_COMMUNITY): Payer: Self-pay | Admitting: Internal Medicine

## 2018-08-17 ENCOUNTER — Ambulatory Visit
Admission: RE | Admit: 2018-08-17 | Discharge: 2018-08-17 | Disposition: A | Discharge status: Home / Self Care | Payer: Medicare Other | Source: Ambulatory Visit | Attending: Internal Medicine | Admitting: Internal Medicine

## 2018-08-17 DIAGNOSIS — R0609 Other forms of dyspnea: Secondary | ICD-10-CM

## 2018-08-22 ENCOUNTER — Telehealth (HOSPITAL_COMMUNITY): Payer: Self-pay

## 2018-08-22 NOTE — Telephone Encounter (Signed)
Encounter complete. 

## 2018-08-23 ENCOUNTER — Telehealth (HOSPITAL_COMMUNITY): Payer: Self-pay

## 2018-08-23 ENCOUNTER — Ambulatory Visit (HOSPITAL_COMMUNITY): Payer: Medicare Other

## 2018-08-23 NOTE — Telephone Encounter (Signed)
Encounter complete. 

## 2018-08-25 ENCOUNTER — Encounter (HOSPITAL_COMMUNITY): Payer: Medicare Other

## 2018-08-25 ENCOUNTER — Telehealth (HOSPITAL_COMMUNITY): Payer: Self-pay

## 2018-08-25 NOTE — Telephone Encounter (Signed)
Encounter complete. 

## 2018-08-29 ENCOUNTER — Encounter (HOSPITAL_COMMUNITY): Payer: Self-pay | Admitting: *Deleted

## 2018-08-29 ENCOUNTER — Ambulatory Visit: Payer: Medicare Other | Admitting: Cardiology

## 2018-08-29 ENCOUNTER — Ambulatory Visit (HOSPITAL_BASED_OUTPATIENT_CLINIC_OR_DEPARTMENT_OTHER)
Admission: RE | Admit: 2018-08-29 | Discharge: 2018-08-29 | Disposition: A | Discharge status: Home / Self Care | Payer: Medicare Other | Source: Ambulatory Visit | Attending: Internal Medicine | Admitting: Internal Medicine

## 2018-08-29 ENCOUNTER — Other Ambulatory Visit: Payer: Self-pay

## 2018-08-29 ENCOUNTER — Encounter: Payer: Self-pay | Admitting: Cardiology

## 2018-08-29 VITALS — BP 136/84 | HR 93 | Ht 72.0 in | Wt 200.0 lb

## 2018-08-29 DIAGNOSIS — R9439 Abnormal result of other cardiovascular function study: Secondary | ICD-10-CM | POA: Diagnosis not present

## 2018-08-29 DIAGNOSIS — R0609 Other forms of dyspnea: Secondary | ICD-10-CM | POA: Insufficient documentation

## 2018-08-29 DIAGNOSIS — E7800 Pure hypercholesterolemia, unspecified: Secondary | ICD-10-CM

## 2018-08-29 DIAGNOSIS — I10 Essential (primary) hypertension: Secondary | ICD-10-CM

## 2018-08-29 DIAGNOSIS — I209 Angina pectoris, unspecified: Secondary | ICD-10-CM | POA: Diagnosis not present

## 2018-08-29 LAB — BASIC METABOLIC PANEL
BUN / CREAT RATIO: 11 (ref 10–24)
BUN: 11 mg/dL (ref 8–27)
CHLORIDE: 105 mmol/L (ref 96–106)
CO2: 26 mmol/L (ref 20–29)
Calcium: 10.3 mg/dL — ABNORMAL HIGH (ref 8.6–10.2)
Creatinine, Ser: 0.96 mg/dL (ref 0.76–1.27)
GFR calc Af Amer: 88 mL/min/{1.73_m2} (ref >59)
GFR calc non Af Amer: 76 mL/min/{1.73_m2} (ref >59)
GLUCOSE: 118 mg/dL — AB (ref 65–99)
POTASSIUM: 4.6 mmol/L (ref 3.5–5.2)
Sodium: 142 mmol/L (ref 134–144)

## 2018-08-29 LAB — MYOCARDIAL PERFUSION IMAGING
CHL CUP NUCLEAR SDS: 10
CSEPED: 5 min
Estimated workload: 7 METS
Exercise duration (sec): 0 s
LV dias vol: 133 mL (ref 62–150)
LV sys vol: 73 mL
MPHR: 144 {beats}/min
NUC STRESS TID: 1.56
Peak HR: 150 {beats}/min
Percent HR: 104 %
RPE: 17
Rest HR: 75 {beats}/min
SRS: 4
SSS: 14

## 2018-08-29 LAB — CBC
HEMATOCRIT: 47.2 % (ref 37.5–51.0)
Hemoglobin: 16.5 g/dL (ref 13.0–17.7)
MCH: 31.1 pg (ref 26.6–33.0)
MCHC: 35 g/dL (ref 31.5–35.7)
MCV: 89 fL (ref 79–97)
Platelets: 243 10*3/uL (ref 150–450)
RBC: 5.3 x10E6/uL (ref 4.14–5.80)
RDW: 13.6 % (ref 11.6–15.4)
WBC: 6.4 10*3/uL (ref 3.4–10.8)

## 2018-08-29 MED ORDER — TECHNETIUM TC 99M TETROFOSMIN IV KIT
31.0000 | PACK | Freq: Once | INTRAVENOUS | Status: AC | PRN
  Administered 2018-08-29: 31 via INTRAVENOUS
  Filled 2018-08-29: qty 31

## 2018-08-29 MED ORDER — ROSUVASTATIN CALCIUM 40 MG PO TABS: 40.0000 mg | ORAL_TABLET | Freq: Every day | ORAL | 3 refills | Status: DC

## 2018-08-29 MED ORDER — METOPROLOL SUCCINATE ER 25 MG PO TB24: 25.0000 mg | ORAL_TABLET | Freq: Every day | ORAL | 3 refills | Status: DC

## 2018-08-29 MED ORDER — TECHNETIUM TC 99M TETROFOSMIN IV KIT
9.6000 | PACK | Freq: Once | INTRAVENOUS | Status: AC | PRN
  Administered 2018-08-29: 9.6 via INTRAVENOUS
  Filled 2018-08-29: qty 10

## 2018-08-29 NOTE — H&P (View-Only) (Signed)
Referring-Larry Roberts Reason for referral-chest pain  HPI: 78 year old male for evaluation of chest pain at request of Wenda Low MD. Chest x-ray August 17, 2018 showed no active cardiopulmonary disease.  Over the past 4 to 5 months the patient has a burning pain in his chest radiating to his throat as well as dyspnea with more vigorous activities relieved with rest.  He has not had any rest pain and does not have symptoms with routine activities.  Patient had stress nuclear study performed today which revealed 2 to 3 mm of ST depression in the inferolateral leads and 1 mm of ST elevation in aVL.  His images revealed ischemia in the anteroseptal wall and apex and what appears to be transient ischemic dilatation.  He also had chest pain as outlined above during the treadmill portion relieved with rest.  He was therefore added to my schedule.  Current Outpatient Medications  Medication Sig Dispense Refill  . ALPRAZolam (XANAX) 0.5 MG tablet Take 0.5 mg by mouth as needed.   0  . amLODipine (NORVASC) 5 MG tablet Take 5 mg by mouth every morning.   0  . aspirin 81 MG tablet Take 81 mg by mouth daily.    . Coenzyme Q10 (CO Q 10 PO) Take 1 capsule by mouth daily.     . dorzolamide-timolol (COSOPT) 22.3-6.8 MG/ML ophthalmic solution Place 1 drop into both eyes 2 (two) times daily.   6  . latanoprost (XALATAN) 0.005 % ophthalmic solution INSTILL 1 DROP INTO BOTH EYES NIGHTLY  2  . Misc Natural Products (OSTEO BI-FLEX JOINT SHIELD) TABS Take 2 tablets by mouth daily.    . ranitidine (ZANTAC) 150 MG tablet Take 150 mg by mouth as needed for heartburn.    . rosuvastatin (CRESTOR) 5 MG tablet Take 5 mg by mouth at bedtime.     . tamsulosin (FLOMAX) 0.4 MG CAPS capsule Take 1 capsule (0.4 mg total) by mouth daily after supper. 30 capsule 5   No current facility-administered medications for this visit.     Allergies  Allergen Reactions  . Levaquin [Levofloxacin] Itching    severe  . Tetanus  Toxoids Other (See Comments)    "black out"     Past Medical History:  Diagnosis Date  . Arthritis   . Carpal tunnel syndrome of left wrist   . GERD (gastroesophageal reflux disease)   . Glaucoma, both eyes   . Hx of colonic polyps   . Hypertension   . Nocturia   . Prostate cancer [facility]) urologist-  dr borden/  oncologist-- dr Tammi Klippel   dx 08/ 2018 Gleason 3+3, PSA 6.8 active surveillane/  dx 09-15-2017 Stage T2a, Gleason 3+4, PSA 3.74-- plan external beam radiation and ADT    Past Surgical History:  Procedure Laterality Date  . CARPAL TUNNEL RELEASE Right 06-30-2007   dr sypher   w/  A-1 pulley release right index and long fingers  . CATARACT EXTRACTION W/ INTRAOCULAR LENS  IMPLANT, BILATERAL  2012 approx.  . COLONOSCOPY W/ POLYPECTOMY    . GOLD SEED IMPLANT N/A 11/14/2017   Procedure: GOLD SEED IMPLANT;  Surgeon: Raynelle Bring, MD;  Location: [facility];  Service: Urology;  Laterality: N/A;  Needs Ultrasound Tech  . LAPAROSCOPY CECECTOMY W/ APPENDECTOMY  05-20-2009  dr Margot Chimes   cecal polyp at appendiceal base  . PROSTATE BIOPSY  09-15-2017;  08/ 2018  . SHOULDER ARTHROSCOPY DISTAL CLAVICLE EXCISION AND OPEN ROTATOR CUFF REPAIR Right 08-06-2008   dr  sypher   w/ debridement, SCD, bursectomy, acrominoplasty and Biceps tenodesis  . SHOULDER ARTHROSCOPY WITH OPEN ROTATOR CUFF REPAIR Left 2018   "and tendon repair's"  . SPACE OAR INSTILLATION N/A 11/14/2017   Procedure: SPACE OAR INSTILLATION;  Surgeon: Raynelle Bring, MD;  Location: [facility];  Service: Urology;  Laterality: N/A;    Social History   Socioeconomic History  . Marital status: Married    Spouse name: Bethena Roys  . Number of children: 2  . Years of education: Not on file  . Highest education level: Not on file  Occupational History  . Occupation: retired  Scientific laboratory technician  . Financial resource strain: Not on file  . Food insecurity:    Worry: Not on file    Inability: Not on file  .  Transportation needs:    Medical: Not on file    Non-medical: Not on file  Tobacco Use  . Smoking status: Former Smoker    Years: 25.00    Types: Cigarettes    Last attempt to quit: 11/11/1986    Years since quitting: 31.8  . Smokeless tobacco: Former Systems developer    Types: [place] date: 11/10/1988  Substance and Sexual Activity  . Alcohol use: Yes    Alcohol/week: 3.0 standard drinks    Types: 3 Cans of beer per week    Frequency: Never    Comment: 4-5 drinks per week  . Drug use: Never  . Sexual activity: Yes  Lifestyle  . Physical activity:    Days per week: Not on file    Minutes per session: Not on file  . Stress: Not on file  Relationships  . Social connections:    Talks on phone: Not on file    Gets together: Not on file    Attends religious service: Not on file    Active member of club or organization: Not on file    Attends meetings of clubs or organizations: Not on file    Relationship status: Not on file  . Intimate partner violence:    Fear of current or ex partner: Not on file    Emotionally abused: Not on file    Physically abused: Not on file    Forced sexual activity: Not on file  Other Topics Concern  . Not on file  Social History Narrative  . Not on file    Family History  Problem Relation Age of Onset  . Prostate cancer Brother   . Hodgkin's lymphoma Daughter   . Breast cancer Daughter   . Melanoma Daughter   . Throat cancer Father   . Breast cancer Sister   . Breast cancer Sister   . Thyroid cancer Sister     ROS: no fevers or chills, productive cough, hemoptysis, dysphasia, odynophagia, melena, hematochezia, dysuria, hematuria, rash, seizure activity, orthopnea, PND, pedal edema, claudication. Remaining systems are negative.  Physical Exam:   Blood pressure 136/84, pulse 93, height 6' (1.829 m), weight 200 lb (90.7 kg).  General:  Well developed/well nourished in NAD Skin warm/dry Patient not depressed No peripheral clubbing  Back-normal HEENT-normal/normal eyelids Neck supple/normal carotid upstroke bilaterally; no bruits; no JVD; no thyromegaly chest - CTA/ normal expansion CV - RRR/normal S1 and S2; no murmurs, rubs or gallops;  PMI nondisplaced Abdomen -NT/ND, no HSM, no mass, + bowel sounds, positive bruit 2+ femoral pulses, no bruits Ext-no edema, chords, 2+ DP Neuro-grossly nonfocal  ECG -normal sinus rhythm with no ST changes.  Personally reviewed  A/P  1 new onset angina-patient presents with classic angina over the past 4 to 5 months.  I have reviewed his stress nuclear study.  He had significant ECG changes and there is ischemia in the anteroseptal wall and apex.  There is also possible transient ischemic dilatation.  He will require catheterization to define anatomy.  The risks and benefits including myocardial infarction, CVA and death discussed and he agrees to proceed.  The procedure has been scheduled for tomorrow.  We will continue with aspirin 81 mg daily.  Continue Crestor.  Add metoprolol XL 25 mg daily.  Continue amlodipine.  2 hypertension-blood pressure is borderline.  Add Toprol 25 mg daily and advance medications as needed.  3 hyperlipidemia-given newly diagnosed coronary disease I will increase Crestor to 40 mg daily.  Check lipids and liver in 6 weeks.  4 bruit-schedule abdominal ultrasound to exclude aneurysm.  Kirk Ruths, MD

## 2018-08-29 NOTE — Patient Instructions (Addendum)
    [facility] [place] [address] Dept: [phone] Loc: [phone]  Larry Roberts  08/29/2018  You are scheduled for a Cardiac Catheterization on Wednesday, March 18 with Dr. Daneen Schick.  1. Please arrive at the [facility] (Main Entrance A) at [facility]: [address] [place], [place] [address] at 9:30 AM (This time is two hours before your procedure to ensure your preparation). Free valet parking service is available.   Special note: Every effort is made to have your procedure done on time. Please understand that emergencies sometimes delay scheduled procedures.  2. Diet: Do not eat solid foods after midnight.  The patient may have clear liquids until 5am upon the day of the procedure.  3. Labs: You will need to have blood drawn today  You do not need to be fasting.  4. Medication instructions in preparation for your procedure:  On the morning of your procedure, take your Aspirin and any morning medicines NOT listed above.  You may use sips of water.  5. Plan for one night stay--bring personal belongings. 6. Bring a current list of your medications and current insurance cards. 7. You MUST have a responsible person to drive you home. 8. Someone MUST be with you the first 24 hours after you arrive home or your discharge will be delayed. 9. Please wear clothes that are easy to get on and off and wear slip-on shoes.  Thank you for allowing [place] to care for you!   -- [facility]   START METOPROLOL 25 MG ONCE DAILY AT BEDTIME  INCREASE ROSUVASTATIN TO 40 MG ONCE DAILY=4 OF THE 10 MG TABLETS ONCE DAILY  Your physician recommends that you return for lab work in: [place] recommends that you schedule a follow-up appointment in: [place]

## 2018-08-29 NOTE — Progress Notes (Signed)
Dr Stanford Breed reviewed Myoview study. Pt scheduled for ROV w/ Dr Stanford Breed today, August 29, 2018 to discuss Myoview results.

## 2018-08-29 NOTE — Progress Notes (Signed)
Referring-Karrar Lysle Rubens Reason for referral-chest pain  HPI: 78 year old male for evaluation of chest pain at request of Wenda Low MD. Chest x-ray August 17, 2018 showed no active cardiopulmonary disease.  Over the past 4 to 5 months the patient has a burning pain in his chest radiating to his throat as well as dyspnea with more vigorous activities relieved with rest.  He has not had any rest pain and does not have symptoms with routine activities.  Patient had stress nuclear study performed today which revealed 2 to 3 mm of ST depression in the inferolateral leads and 1 mm of ST elevation in aVL.  His images revealed ischemia in the anteroseptal wall and apex and what appears to be transient ischemic dilatation.  He also had chest pain as outlined above during the treadmill portion relieved with rest.  He was therefore added to my schedule.  Current Outpatient Medications  Medication Sig Dispense Refill  . ALPRAZolam (XANAX) 0.5 MG tablet Take 0.5 mg by mouth as needed.   0  . amLODipine (NORVASC) 5 MG tablet Take 5 mg by mouth every morning.   0  . aspirin 81 MG tablet Take 81 mg by mouth daily.    . Coenzyme Q10 (CO Q 10 PO) Take 1 capsule by mouth daily.     . dorzolamide-timolol (COSOPT) 22.3-6.8 MG/ML ophthalmic solution Place 1 drop into both eyes 2 (two) times daily.   6  . latanoprost (XALATAN) 0.005 % ophthalmic solution INSTILL 1 DROP INTO BOTH EYES NIGHTLY  2  . Misc Natural Products (OSTEO BI-FLEX JOINT SHIELD) TABS Take 2 tablets by mouth daily.    . ranitidine (ZANTAC) 150 MG tablet Take 150 mg by mouth as needed for heartburn.    . rosuvastatin (CRESTOR) 5 MG tablet Take 5 mg by mouth at bedtime.     . tamsulosin (FLOMAX) 0.4 MG CAPS capsule Take 1 capsule (0.4 mg total) by mouth daily after supper. 30 capsule 5   No current facility-administered medications for this visit.     Allergies  Allergen Reactions  . Levaquin [Levofloxacin] Itching    severe  . Tetanus  Toxoids Other (See Comments)    "black out"     Past Medical History:  Diagnosis Date  . Arthritis   . Carpal tunnel syndrome of left wrist   . GERD (gastroesophageal reflux disease)   . Glaucoma, both eyes   . Hx of colonic polyps   . Hypertension   . Nocturia   . Prostate cancer [facility]) urologist-  dr borden/  oncologist-- dr Tammi Klippel   dx 08/ 2018 Gleason 3+3, PSA 6.8 active surveillane/  dx 09-15-2017 Stage T2a, Gleason 3+4, PSA 3.74-- plan external beam radiation and ADT    Past Surgical History:  Procedure Laterality Date  . CARPAL TUNNEL RELEASE Right 06-30-2007   dr sypher   w/  A-1 pulley release right index and long fingers  . CATARACT EXTRACTION W/ INTRAOCULAR LENS  IMPLANT, BILATERAL  2012 approx.  . COLONOSCOPY W/ POLYPECTOMY    . GOLD SEED IMPLANT N/A 11/14/2017   Procedure: GOLD SEED IMPLANT;  Surgeon: Raynelle Bring, MD;  Location: [facility];  Service: Urology;  Laterality: N/A;  Needs Ultrasound Tech  . LAPAROSCOPY CECECTOMY W/ APPENDECTOMY  05-20-2009  dr Margot Chimes   cecal polyp at appendiceal base  . PROSTATE BIOPSY  09-15-2017;  08/ 2018  . SHOULDER ARTHROSCOPY DISTAL CLAVICLE EXCISION AND OPEN ROTATOR CUFF REPAIR Right 08-06-2008   dr  sypher   w/ debridement, SCD, bursectomy, acrominoplasty and Biceps tenodesis  . SHOULDER ARTHROSCOPY WITH OPEN ROTATOR CUFF REPAIR Left 2018   "and tendon repair's"  . SPACE OAR INSTILLATION N/A 11/14/2017   Procedure: SPACE OAR INSTILLATION;  Surgeon: Raynelle Bring, MD;  Location: [facility];  Service: Urology;  Laterality: N/A;    Social History   Socioeconomic History  . Marital status: Married    Spouse name: Bethena Roys  . Number of children: 2  . Years of education: Not on file  . Highest education level: Not on file  Occupational History  . Occupation: retired  Scientific laboratory technician  . Financial resource strain: Not on file  . Food insecurity:    Worry: Not on file    Inability: Not on file  .  Transportation needs:    Medical: Not on file    Non-medical: Not on file  Tobacco Use  . Smoking status: Former Smoker    Years: 25.00    Types: Cigarettes    Last attempt to quit: 11/11/1986    Years since quitting: 31.8  . Smokeless tobacco: Former Systems developer    Types: [place] date: 11/10/1988  Substance and Sexual Activity  . Alcohol use: Yes    Alcohol/week: 3.0 standard drinks    Types: 3 Cans of beer per week    Frequency: Never    Comment: 4-5 drinks per week  . Drug use: Never  . Sexual activity: Yes  Lifestyle  . Physical activity:    Days per week: Not on file    Minutes per session: Not on file  . Stress: Not on file  Relationships  . Social connections:    Talks on phone: Not on file    Gets together: Not on file    Attends religious service: Not on file    Active member of club or organization: Not on file    Attends meetings of clubs or organizations: Not on file    Relationship status: Not on file  . Intimate partner violence:    Fear of current or ex partner: Not on file    Emotionally abused: Not on file    Physically abused: Not on file    Forced sexual activity: Not on file  Other Topics Concern  . Not on file  Social History Narrative  . Not on file    Family History  Problem Relation Age of Onset  . Prostate cancer Brother   . Hodgkin's lymphoma Daughter   . Breast cancer Daughter   . Melanoma Daughter   . Throat cancer Father   . Breast cancer Sister   . Breast cancer Sister   . Thyroid cancer Sister     ROS: no fevers or chills, productive cough, hemoptysis, dysphasia, odynophagia, melena, hematochezia, dysuria, hematuria, rash, seizure activity, orthopnea, PND, pedal edema, claudication. Remaining systems are negative.  Physical Exam:   Blood pressure 136/84, pulse 93, height 6' (1.829 m), weight 200 lb (90.7 kg).  General:  Well developed/well nourished in NAD Skin warm/dry Patient not depressed No peripheral clubbing  Back-normal HEENT-normal/normal eyelids Neck supple/normal carotid upstroke bilaterally; no bruits; no JVD; no thyromegaly chest - CTA/ normal expansion CV - RRR/normal S1 and S2; no murmurs, rubs or gallops;  PMI nondisplaced Abdomen -NT/ND, no HSM, no mass, + bowel sounds, positive bruit 2+ femoral pulses, no bruits Ext-no edema, chords, 2+ DP Neuro-grossly nonfocal  ECG -normal sinus rhythm with no ST changes.  Personally reviewed  A/P  1 new onset angina-patient presents with classic angina over the past 4 to 5 months.  I have reviewed his stress nuclear study.  He had significant ECG changes and there is ischemia in the anteroseptal wall and apex.  There is also possible transient ischemic dilatation.  He will require catheterization to define anatomy.  The risks and benefits including myocardial infarction, CVA and death discussed and he agrees to proceed.  The procedure has been scheduled for tomorrow.  We will continue with aspirin 81 mg daily.  Continue Crestor.  Add metoprolol XL 25 mg daily.  Continue amlodipine.  2 hypertension-blood pressure is borderline.  Add Toprol 25 mg daily and advance medications as needed.  3 hyperlipidemia-given newly diagnosed coronary disease I will increase Crestor to 40 mg daily.  Check lipids and liver in 6 weeks.  4 bruit-schedule abdominal ultrasound to exclude aneurysm.  Kirk Ruths, MD

## 2018-08-30 ENCOUNTER — Encounter (HOSPITAL_COMMUNITY): Payer: Self-pay | Admitting: Interventional Cardiology

## 2018-08-30 ENCOUNTER — Inpatient Hospital Stay (HOSPITAL_COMMUNITY)
Admission: RE | Admit: 2018-08-30 | Discharge: 2018-09-06 | DRG: 234 | Disposition: A | Discharge status: Home / Self Care | Payer: Medicare Other | Attending: Surgery | Admitting: Surgery

## 2018-08-30 ENCOUNTER — Other Ambulatory Visit: Payer: Self-pay

## 2018-08-30 ENCOUNTER — Encounter (HOSPITAL_COMMUNITY)
Admission: RE | Disposition: A | Discharge status: Home / Self Care | Payer: Self-pay | Source: Home / Self Care | Attending: Surgery

## 2018-08-30 DIAGNOSIS — E876 Hypokalemia: Secondary | ICD-10-CM | POA: Diagnosis not present

## 2018-08-30 DIAGNOSIS — Z808 Family history of malignant neoplasm of other organs or systems: Secondary | ICD-10-CM

## 2018-08-30 DIAGNOSIS — Z9842 Cataract extraction status, left eye: Secondary | ICD-10-CM | POA: Diagnosis not present

## 2018-08-30 DIAGNOSIS — I10 Essential (primary) hypertension: Secondary | ICD-10-CM | POA: Diagnosis not present

## 2018-08-30 DIAGNOSIS — Z79899 Other long term (current) drug therapy: Secondary | ICD-10-CM

## 2018-08-30 DIAGNOSIS — I200 Unstable angina: Secondary | ICD-10-CM | POA: Diagnosis present

## 2018-08-30 DIAGNOSIS — Z9841 Cataract extraction status, right eye: Secondary | ICD-10-CM | POA: Diagnosis not present

## 2018-08-30 DIAGNOSIS — Z01810 Encounter for preprocedural cardiovascular examination: Secondary | ICD-10-CM | POA: Diagnosis not present

## 2018-08-30 DIAGNOSIS — I25110 Atherosclerotic heart disease of native coronary artery with unstable angina pectoris: Principal | ICD-10-CM

## 2018-08-30 DIAGNOSIS — E782 Mixed hyperlipidemia: Secondary | ICD-10-CM | POA: Diagnosis present

## 2018-08-30 DIAGNOSIS — Z87891 Personal history of nicotine dependence: Secondary | ICD-10-CM

## 2018-08-30 DIAGNOSIS — D62 Acute posthemorrhagic anemia: Secondary | ICD-10-CM | POA: Diagnosis not present

## 2018-08-30 DIAGNOSIS — Z7982 Long term (current) use of aspirin: Secondary | ICD-10-CM | POA: Diagnosis not present

## 2018-08-30 DIAGNOSIS — R9439 Abnormal result of other cardiovascular function study: Secondary | ICD-10-CM

## 2018-08-30 DIAGNOSIS — K219 Gastro-esophageal reflux disease without esophagitis: Secondary | ICD-10-CM | POA: Diagnosis present

## 2018-08-30 DIAGNOSIS — R079 Chest pain, unspecified: Secondary | ICD-10-CM | POA: Diagnosis not present

## 2018-08-30 DIAGNOSIS — C61 Malignant neoplasm of prostate: Secondary | ICD-10-CM | POA: Diagnosis present

## 2018-08-30 DIAGNOSIS — M1990 Unspecified osteoarthritis, unspecified site: Secondary | ICD-10-CM | POA: Diagnosis present

## 2018-08-30 DIAGNOSIS — Z961 Presence of intraocular lens: Secondary | ICD-10-CM | POA: Diagnosis present

## 2018-08-30 DIAGNOSIS — Z8042 Family history of malignant neoplasm of prostate: Secondary | ICD-10-CM | POA: Diagnosis not present

## 2018-08-30 DIAGNOSIS — Z881 Allergy status to other antibiotic agents status: Secondary | ICD-10-CM | POA: Diagnosis not present

## 2018-08-30 DIAGNOSIS — E8770 Fluid overload, unspecified: Secondary | ICD-10-CM | POA: Diagnosis not present

## 2018-08-30 DIAGNOSIS — Z951 Presence of aortocoronary bypass graft: Secondary | ICD-10-CM | POA: Diagnosis not present

## 2018-08-30 DIAGNOSIS — I25119 Atherosclerotic heart disease of native coronary artery with unspecified angina pectoris: Secondary | ICD-10-CM

## 2018-08-30 DIAGNOSIS — Z807 Family history of other malignant neoplasms of lymphoid, hematopoietic and related tissues: Secondary | ICD-10-CM

## 2018-08-30 DIAGNOSIS — Z803 Family history of malignant neoplasm of breast: Secondary | ICD-10-CM | POA: Diagnosis not present

## 2018-08-30 DIAGNOSIS — Z887 Allergy status to serum and vaccine status: Secondary | ICD-10-CM | POA: Diagnosis not present

## 2018-08-30 DIAGNOSIS — Z09 Encounter for follow-up examination after completed treatment for conditions other than malignant neoplasm: Secondary | ICD-10-CM

## 2018-08-30 DIAGNOSIS — H409 Unspecified glaucoma: Secondary | ICD-10-CM | POA: Diagnosis present

## 2018-08-30 HISTORY — PX: LEFT HEART CATH AND CORONARY ANGIOGRAPHY: CATH118249

## 2018-08-30 HISTORY — DX: Atherosclerotic heart disease of native coronary artery without angina pectoris: I25.10

## 2018-08-30 SURGERY — LEFT HEART CATH AND CORONARY ANGIOGRAPHY
Anesthesia: LOCAL

## 2018-08-30 MED ORDER — ALPRAZOLAM 0.25 MG PO TABS
0.2500 mg | ORAL_TABLET | Freq: Every day | ORAL | Status: DC
  Administered 2018-08-30 – 2018-08-31 (×2): 0.25 mg via ORAL
  Filled 2018-08-30 (×2): qty 1

## 2018-08-30 MED ORDER — VERAPAMIL HCL 2.5 MG/ML IV SOLN
INTRAVENOUS | Status: AC
  Filled 2018-08-30: qty 2

## 2018-08-30 MED ORDER — ALPRAZOLAM 0.5 MG PO TABS
0.5000 mg | ORAL_TABLET | Freq: Every day | ORAL | Status: DC | PRN
  Administered 2018-08-30: 0.5 mg via ORAL
  Filled 2018-08-30: qty 1

## 2018-08-30 MED ORDER — ROSUVASTATIN CALCIUM 20 MG PO TABS
40.0000 mg | ORAL_TABLET | Freq: Every day | ORAL | Status: DC
  Administered 2018-08-30 – 2018-09-05 (×7): 40 mg via ORAL
  Filled 2018-08-30 (×7): qty 2

## 2018-08-30 MED ORDER — DORZOLAMIDE HCL-TIMOLOL MAL 2-0.5 % OP SOLN
1.0000 [drp] | Freq: Two times a day (BID) | OPHTHALMIC | Status: DC
  Administered 2018-08-30 – 2018-09-06 (×13): 1 [drp] via OPHTHALMIC
  Filled 2018-08-30: qty 10

## 2018-08-30 MED ORDER — SODIUM CHLORIDE 0.9 % WEIGHT BASED INFUSION: 1.0000 mL/kg/h | INTRAVENOUS | Status: DC

## 2018-08-30 MED ORDER — HEPARIN SODIUM (PORCINE) 1000 UNIT/ML IJ SOLN
INTRAMUSCULAR | Status: DC | PRN
  Administered 2018-08-30: 5000 [IU] via INTRAVENOUS

## 2018-08-30 MED ORDER — HEPARIN SODIUM (PORCINE) 1000 UNIT/ML IJ SOLN
INTRAMUSCULAR | Status: AC
  Filled 2018-08-30: qty 1

## 2018-08-30 MED ORDER — ASPIRIN 81 MG PO CHEW: 81.0000 mg | CHEWABLE_TABLET | ORAL | Status: DC

## 2018-08-30 MED ORDER — VERAPAMIL HCL 2.5 MG/ML IV SOLN
INTRAVENOUS | Status: DC | PRN
  Administered 2018-08-30: 10 mL via INTRA_ARTERIAL

## 2018-08-30 MED ORDER — LIDOCAINE HCL (PF) 1 % IJ SOLN
INTRAMUSCULAR | Status: AC
  Filled 2018-08-30: qty 30

## 2018-08-30 MED ORDER — HEPARIN (PORCINE) IN NACL 1000-0.9 UT/500ML-% IV SOLN
INTRAVENOUS | Status: AC
  Filled 2018-08-30: qty 1000

## 2018-08-30 MED ORDER — HEPARIN (PORCINE) 25000 UT/250ML-% IV SOLN
1150.0000 [IU]/h | INTRAVENOUS | Status: DC
  Administered 2018-08-30 – 2018-08-31 (×2): 1150 [IU]/h via INTRAVENOUS
  Filled 2018-08-30 (×2): qty 250

## 2018-08-30 MED ORDER — SODIUM CHLORIDE 0.9% FLUSH
3.0000 mL | Freq: Two times a day (BID) | INTRAVENOUS | Status: DC
  Administered 2018-08-31: 3 mL via INTRAVENOUS

## 2018-08-30 MED ORDER — FENTANYL CITRATE (PF) 100 MCG/2ML IJ SOLN
INTRAMUSCULAR | Status: AC
  Filled 2018-08-30: qty 2

## 2018-08-30 MED ORDER — HEPARIN (PORCINE) IN NACL 1000-0.9 UT/500ML-% IV SOLN
INTRAVENOUS | Status: DC | PRN
  Administered 2018-08-30 (×2): 500 mL

## 2018-08-30 MED ORDER — OSTEO BI-FLEX JOINT SHIELD PO TABS: 2.0000 | ORAL_TABLET | Freq: Every day | ORAL | Status: DC

## 2018-08-30 MED ORDER — METOPROLOL SUCCINATE ER 25 MG PO TB24
50.0000 mg | ORAL_TABLET | Freq: Every day | ORAL | Status: DC
  Administered 2018-08-31: 50 mg via ORAL
  Filled 2018-08-30: qty 2

## 2018-08-30 MED ORDER — SODIUM CHLORIDE 0.9 % IV SOLN: 250.0000 mL | INTRAVENOUS | Status: DC | PRN

## 2018-08-30 MED ORDER — COQ10 100 MG PO CAPS: 100.0000 mg | ORAL_CAPSULE | Freq: Every day | ORAL | Status: DC

## 2018-08-30 MED ORDER — SODIUM CHLORIDE 0.9% FLUSH: 3.0000 mL | Freq: Two times a day (BID) | INTRAVENOUS | Status: DC

## 2018-08-30 MED ORDER — LATANOPROST 0.005 % OP SOLN
1.0000 [drp] | Freq: Every day | OPHTHALMIC | Status: DC
  Administered 2018-08-30 – 2018-09-05 (×6): 1 [drp] via OPHTHALMIC
  Filled 2018-08-30: qty 2.5

## 2018-08-30 MED ORDER — SODIUM CHLORIDE 0.9% FLUSH: 3.0000 mL | INTRAVENOUS | Status: DC | PRN

## 2018-08-30 MED ORDER — LIDOCAINE HCL (PF) 1 % IJ SOLN
INTRAMUSCULAR | Status: DC | PRN
  Administered 2018-08-30: 2 mL via INTRADERMAL

## 2018-08-30 MED ORDER — ATORVASTATIN CALCIUM 80 MG PO TABS: 80.0000 mg | ORAL_TABLET | Freq: Every day | ORAL | Status: DC

## 2018-08-30 MED ORDER — FENTANYL CITRATE (PF) 100 MCG/2ML IJ SOLN
INTRAMUSCULAR | Status: DC | PRN
  Administered 2018-08-30 (×2): 25 ug via INTRAVENOUS

## 2018-08-30 MED ORDER — ASPIRIN EC 81 MG PO TBEC: 81.0000 mg | DELAYED_RELEASE_TABLET | Freq: Every day | ORAL | Status: DC

## 2018-08-30 MED ORDER — POLYVINYL ALCOHOL 1.4 % OP SOLN
1.0000 [drp] | Freq: Three times a day (TID) | OPHTHALMIC | Status: DC | PRN
  Filled 2018-08-30: qty 15

## 2018-08-30 MED ORDER — AMLODIPINE BESYLATE 5 MG PO TABS
5.0000 mg | ORAL_TABLET | Freq: Every day | ORAL | Status: DC
  Administered 2018-08-30 – 2018-08-31 (×2): 5 mg via ORAL
  Filled 2018-08-30 (×2): qty 1

## 2018-08-30 MED ORDER — OXYCODONE HCL 5 MG PO TABS: 5.0000 mg | ORAL_TABLET | ORAL | Status: DC | PRN

## 2018-08-30 MED ORDER — SODIUM CHLORIDE 0.9 % WEIGHT BASED INFUSION
3.0000 mL/kg/h | INTRAVENOUS | Status: DC
  Administered 2018-08-30: 3 mL/kg/h via INTRAVENOUS

## 2018-08-30 MED ORDER — MIDAZOLAM HCL 2 MG/2ML IJ SOLN
INTRAMUSCULAR | Status: DC | PRN
  Administered 2018-08-30: 0.5 mg via INTRAVENOUS
  Administered 2018-08-30: 1 mg via INTRAVENOUS

## 2018-08-30 MED ORDER — ASPIRIN 81 MG PO CHEW
81.0000 mg | CHEWABLE_TABLET | Freq: Every day | ORAL | Status: DC
  Administered 2018-08-31: 81 mg via ORAL
  Filled 2018-08-30: qty 1

## 2018-08-30 MED ORDER — SODIUM CHLORIDE 0.9 % IV SOLN: INTRAVENOUS | Status: AC

## 2018-08-30 MED ORDER — FAMOTIDINE 20 MG PO TABS
20.0000 mg | ORAL_TABLET | Freq: Every day | ORAL | Status: DC
  Administered 2018-08-30 – 2018-08-31 (×2): 20 mg via ORAL
  Filled 2018-08-30 (×2): qty 1

## 2018-08-30 MED ORDER — TAMSULOSIN HCL 0.4 MG PO CAPS
0.4000 mg | ORAL_CAPSULE | ORAL | Status: DC
  Administered 2018-08-30 – 2018-09-02 (×2): 0.4 mg via ORAL
  Filled 2018-08-30 (×3): qty 1

## 2018-08-30 MED ORDER — IOHEXOL 350 MG/ML SOLN
INTRAVENOUS | Status: DC | PRN
  Administered 2018-08-30: 125 mL via INTRAVENOUS

## 2018-08-30 MED ORDER — ONDANSETRON HCL 4 MG/2ML IJ SOLN: 4.0000 mg | Freq: Four times a day (QID) | INTRAMUSCULAR | Status: DC | PRN

## 2018-08-30 MED ORDER — ACETAMINOPHEN 325 MG PO TABS: 650.0000 mg | ORAL_TABLET | ORAL | Status: DC | PRN

## 2018-08-30 MED ORDER — MIDAZOLAM HCL 2 MG/2ML IJ SOLN
INTRAMUSCULAR | Status: AC
  Filled 2018-08-30: qty 2

## 2018-08-30 SURGICAL SUPPLY — 11 items
CATH 5FR JL3.5 JR4 ANG PIG MP (CATHETERS) ×1 IMPLANT
CATH VISTA GUIDE 6FR XBLAD3.5 (CATHETERS) ×1 IMPLANT
DEVICE RAD COMP TR BAND LRG (VASCULAR PRODUCTS) ×1 IMPLANT
GLIDESHEATH SLEND A-KIT 6F 22G (SHEATH) ×1 IMPLANT
GUIDEWIRE INQWIRE 1.5J.035X260 (WIRE) IMPLANT
INQWIRE 1.5J .035X260CM (WIRE) ×2
KIT HEART LEFT (KITS) ×2 IMPLANT
PACK CARDIAC CATHETERIZATION (CUSTOM PROCEDURE TRAY) ×2 IMPLANT
SHEATH PROBE COVER 6X72 (BAG) ×1 IMPLANT
TRANSDUCER W/STOPCOCK (MISCELLANEOUS) ×2 IMPLANT
TUBING CIL FLEX 10 FLL-RA (TUBING) ×2 IMPLANT

## 2018-08-30 NOTE — Plan of Care (Signed)
  Problem: Education: Goal: Knowledge of General Education information will improve Description Including pain rating scale, medication(s)/side effects and non-pharmacologic comfort measures Outcome: Progressing   

## 2018-08-30 NOTE — Consult Note (Signed)
[address]       [place], [address]             [phone]        Larry Roberts G[facility] Medical Record #[id number] Date of Birth: [birth year]  Referring: No ref. provider found Primary Care: Wenda Low, MD Primary Cardiologist:No primary care provider on file.  Chief Complaint: Chest pain with abnormal nuclear stress test  History of Present Illness:    The patient is a 78 year old male who has recently begun an for chest pain symptoms.  For the past approximate 4 to 5 months he has developed a burning chest discomfort with some radiation to his throat and right anterior shoulder area,  associated with dyspnea with more vigorous activities that is relieved with a few minutes rest.  He has not had any of the symptoms with routine activity.  A nuclear stress test was done which revealed 2 to 3 mm of ST depression in the inferolateral lateral leads and a 4mm ST elevation in lead aVL.  His images did reveal findings consistent with ischemia in the anteroseptal wall and apex which appears to be transient ischemic dilatation.  Due to these findings he was subsequently felt to require cardiac catheterization which was done on today's date revealing severe multivessel coronary artery disease.  We are asked to see the patient in cardiothoracic surgical consultation for consideration of coronary artery surgical revascularization.  Please see the full report listed below.  He does have cardiac risk factors inclusive of hypertension and hyperlipidemia as well as remote tobacco abuse having quit cigarettes in 1988.  He is recently been diagnosed with prostate cancer.   Current Activity/ Functional Status: Patient is independent with mobility/ambulation, transfers, ADL's, IADL's.   Zubrod Score: At the time of surgery this patient's most appropriate activity status/level should be described as: []     0    Normal activity, no symptoms [x]     1    Restricted in physical  strenuous activity but ambulatory, able to do out light work []     2    Ambulatory and capable of self care, unable to do work activities, up and about                 more than 50%  Of the time                            []     3    Only limited self care, in bed greater than 50% of waking hours []     4    Completely disabled, no self care, confined to bed or chair []     5    Moribund  Past Medical History:  Diagnosis Date  . Arthritis   . Carpal tunnel syndrome of left wrist   . GERD (gastroesophageal reflux disease)   . Glaucoma, both eyes   . Hx of colonic polyps   . Hypertension   . Nocturia   . Prostate cancer [facility]) urologist-  dr borden/  oncologist-- dr Tammi Klippel   dx 08/ 2018 Gleason 3+3, PSA 6.8 active surveillane/  dx 09-15-2017 Stage T2a, Gleason 3+4, PSA 3.74-- plan external beam radiation and ADT    Past Surgical History:  Procedure Laterality Date  . CARPAL TUNNEL RELEASE Right 06-30-2007   dr sypher   w/  A-1 pulley release right index and long fingers  . CATARACT EXTRACTION  W/ INTRAOCULAR LENS  IMPLANT, BILATERAL  2012 approx.  . COLONOSCOPY W/ POLYPECTOMY    .  SEED IMPLANT N/A 11/14/2017   Procedure:  SEED IMPLANT;  Surgeon: Raynelle Bring, MD;  Location: [facility];  Service: Urology;  Laterality: N/A;  Needs Ultrasound Tech  . LAPAROSCOPY CECECTOMY W/ APPENDECTOMY  05-20-2009  dr Margot Chimes   cecal polyp at appendiceal base  . PROSTATE BIOPSY  09-15-2017;  08/ 2018  . SHOULDER ARTHROSCOPY DISTAL CLAVICLE EXCISION AND OPEN ROTATOR CUFF REPAIR Right 08-06-2008   dr sypher   w/ debridement, SCD, bursectomy, acrominoplasty and Biceps tenodesis  . SHOULDER ARTHROSCOPY WITH OPEN ROTATOR CUFF REPAIR Left 2018   "and tendon repair's"  . SPACE OAR INSTILLATION N/A 11/14/2017   Procedure: SPACE OAR INSTILLATION;  Surgeon: Raynelle Bring, MD;  Location: [facility];  Service: Urology;  Laterality: N/A;    Social History   Tobacco Use   Smoking Status Former Smoker  . Years: 25.00  . Types: Cigarettes  . Last attempt to quit: 11/11/1986  . Years since quitting: 31.8  Smokeless Tobacco Former Systems developer  . Types: Chew  . Quit date: 11/10/1988    Social History   Substance and Sexual Activity  Alcohol Use Yes  . Alcohol/week: 3.0 standard drinks  . Types: 3 Cans of beer per week  . Frequency: Never   Comment: 4-5 drinks per week     Allergies  Allergen Reactions  . Levaquin [Levofloxacin] Itching    severe  . Tetanus Toxoids Other (See Comments)    "black out"    Current Facility-Administered Medications  Medication Dose Route Frequency Provider Last Rate Last Dose  . 0.9 %  sodium chloride infusion  250 mL Intravenous PRN Lelon Perla, MD      . 0.9 %  sodium chloride infusion   Intravenous Continuous Belva Crome, MD 125 mL/hr at 08/30/18 1255    . 0.9% sodium chloride infusion  1 mL/kg/hr Intravenous Continuous Lelon Perla, MD 90.7 mL/hr at 08/30/18 1141 1 mL/kg/hr at 08/30/18 1141  . aspirin chewable tablet 81 mg  81 mg Oral Pre-Cath Crenshaw, Denice Bors, MD      . sodium chloride flush (NS) 0.9 % injection 3 mL  3 mL Intravenous Q12H Lelon Perla, MD      . sodium chloride flush (NS) 0.9 % injection 3 mL  3 mL Intravenous PRN Lelon Perla, MD        Medications Prior to Admission  Medication Sig Dispense Refill Last Dose  . acetaminophen (TYLENOL) 500 MG tablet Take 500 mg by mouth every 6 (six) hours as needed (pain/headaches.).   prn  . ALPRAZolam (XANAX) 0.5 MG tablet Take 0.25 mg by mouth at bedtime.   0 08/30/2018 at 0600  . amLODipine (NORVASC) 5 MG tablet Take 5 mg by mouth daily.   0 08/29/2018 at 1400  . aspirin EC 81 MG tablet Take 81 mg by mouth daily.   08/30/2018 at 0600  . carboxymethylcellulose (REFRESH PLUS) 0.5 % SOLN Place 1 drop into both eyes 3 (three) times daily as needed (dry/irritated eyes.).   08/30/2018 at 0600  . Coenzyme Q10 (COQ10) 100 MG CAPS Take 100 mg by mouth  daily.   08/30/2018 at 0600  . dorzolamide-timolol (COSOPT) 22.3-6.8 MG/ML ophthalmic solution Place 1 drop into both eyes 2 (two) times daily.   6 08/30/2018 at 0600  . latanoprost (XALATAN) 0.005 % ophthalmic solution Place 1 drop into  both eyes at bedtime.   2 08/30/2018 at 0600  . metoprolol succinate (TOPROL XL) 25 MG 24 hr tablet Take 1 tablet (25 mg total) by mouth daily. 90 tablet 3 08/30/2018 at 0600  . Misc Natural Products (OSTEO BI-FLEX JOINT SHIELD) TABS Take 2 tablets by mouth daily.   08/30/2018 at 0600  . ranitidine (ZANTAC) 150 MG tablet Take 150 mg by mouth every other day.    08/29/2018 at pm  . rosuvastatin (CRESTOR) 40 MG tablet Take 1 tablet (40 mg total) by mouth at bedtime. 90 tablet 3 08/30/2018 at 0600  . tamsulosin (FLOMAX) 0.4 MG CAPS capsule Take 1 capsule (0.4 mg total) by mouth daily after supper. (Patient taking differently: Take 0.4 mg by mouth every 3 (three) days. ) 30 capsule 5 Past Week at Unknown time    Family History  Problem Relation Age of Onset  . Prostate cancer Brother   . Hodgkin's lymphoma Daughter   . Breast cancer Daughter   . Melanoma Daughter   . Throat cancer Father   . Breast cancer Sister   . Breast cancer Sister   . Thyroid cancer Sister      Review of Systems:   Review of Systems  Constitutional: Positive for malaise/fatigue and weight loss. Negative for chills, diaphoresis and fever.  HENT: Negative for congestion, ear discharge, ear pain, hearing loss, nosebleeds, sinus pain, sore throat and tinnitus.   Eyes: Negative for blurred vision, double vision, photophobia, pain, discharge and redness.  Respiratory: Positive for shortness of breath. Negative for cough, hemoptysis, sputum production, wheezing and stridor.   Cardiovascular: Positive for chest pain. Negative for palpitations, orthopnea, claudication, leg swelling and PND.  Gastrointestinal: Negative for abdominal pain, blood in stool, constipation, diarrhea, heartburn, melena,  nausea and vomiting.       Hemorrhoids  No recent bleeding  Genitourinary: Positive for frequency and urgency. Negative for dysuria, flank pain and hematuria.       Left kidney cyst  Musculoskeletal: Positive for joint pain. Negative for back pain, falls, myalgias and neck pain.       Hip pain  Skin: Negative for itching and rash.  Neurological: Positive for dizziness, tingling, sensory change and speech change. Negative for focal weakness, seizures, loss of consciousness, weakness and headaches.       Some dizziness with flomax Left hand carpal tunnel sx, has had right carpal tunnel surgery + hoareseness at times  Endo/Heme/Allergies: Negative for environmental allergies and polydipsia. Does not bruise/bleed easily.  Psychiatric/Behavioral: Negative for depression, hallucinations, memory loss, substance abuse and suicidal ideas. The patient has insomnia. The patient is not nervous/anxious.       Physical Exam: BP 126/71   Pulse (!) 59   Temp 98.2 F (36.8 C) (Oral)   Resp 16   Ht 5\' 10"  (1.778 m)   Wt 88.5 kg   SpO2 98%   BMI 27.98 kg/m    Physical Exam  Constitutional: He appears healthy. No distress.  HENT:  Mouth/Throat: Dentition is normal. No dental caries. Oropharynx is clear. Pharynx is normal.  Eyes: Pupils are equal, round, and reactive to light. Conjunctivae are normal.  Neck: Normal range of motion and thyroid normal. Neck supple. No JVD present. No neck adenopathy. No thyromegaly present.  Cardiovascular: Regular rhythm, normal heart sounds and intact distal pulses. Exam reveals no S3 and no S4.  No murmur heard. Pulmonary/Chest: Effort normal and breath sounds normal. No stridor. He has no wheezes. He has no rales. He  exhibits no tenderness.  Abdominal: Soft. Bowel sounds are normal. He exhibits no distension and no mass. There is no hepatomegaly. There is no abdominal tenderness.  Musculoskeletal:        General: No tenderness, deformity or edema.   Neurological: He is alert and oriented to person, place, and time. He has normal motor skills. Gait normal.  Skin: Skin is warm and dry. No rash noted. No cyanosis. No jaundice or pallor. Nails show no clubbing.    Diagnostic Studies & Laboratory data:     Recent Radiology Findings:   No results found.   I have independently reviewed the above radiologic studies and discussed with the patient   Recent Lab Findings: Lab Results  Component Value Date   WBC 6.4 08/29/2018   HGB 16.5 08/29/2018   HCT 47.2 08/29/2018   PLT 243 08/29/2018   GLUCOSE 118 (H) 08/29/2018   ALT 32 05/15/2009   AST 28 05/15/2009   NA 142 08/29/2018   K 4.6 08/29/2018   CL 105 08/29/2018   CREATININE 0.96 08/29/2018   BUN 11 08/29/2018   CO2 26 08/29/2018   Study Highlights Nuclear Stress test   The patient walked for 5 minutes of a standard Bruce protocol treadmill test. He achieved a peak heart rate of 150 which is 104% predicted maximal heart rate.  Downsloping ST segment depression ST segment depression of 3 mm was noted during stress in the II, III, aVF, V4, V5 and V6 leads, beginning at 5 minutes of stress.  T wave inversion was noted during stress.  Nuclear stress EF: 45%. The left ventricular ejection fraction is mildly decreased (45-54%).  Defect 1: There is a large defect of severe severity present in the basal anteroseptal, mid anteroseptal, apical anterior, apical septal and apex location.  Findings consistent with anterior apical ischemia.  The left ventricular ejection fraction is mildly decreased (45-54%).  This is a high risk study. He has anterior apical ischemia.    Nuclear History and Indications   History and Indications Indication for Stress Test: Diagnosis of coronary disease History: No prior cardiac or respiratory history reported; no prior NUC MPI for comparison. Cardiac Risk Factors: History of Smoking, Hypertension, Lipids and Anxiety; GERD; Prostate CA;  Symptoms:  Chest Pain "Burning"and DOE  Stress Findings   ECG Baseline ECG exhibits normal sinus rhythm..  Stress Findings The patient exercised following the Bruce protocol.   The test was stopped because the patient complained of shortness of breath.   85% of maximum heart rate was achieved after 2 minutes. Recovery time: 10 minutes.  Response to Stress Downsloping ST segment depression ST segment depression of 3 mm was noted during stress in the II, III, aVF, V4, V5 and V6 leads, beginning at 5 minutes of stress.  T wave inversion was noted during stress. Arrhythmias during stress: occasional PVCs.  Arrhythmias were not significant.  ECG was interpretable and conclusive.  Stress Measurements   Baseline Vitals  Rest HR 75 bpm    Rest BP 146/83 mmHg    Exercise Time  Exercise duration (min) 5 min    Exercise duration (sec) 0 sec    Peak Stress Vitals  Peak HR 150 bpm    Peak BP 196/88 mmHg    Exercise Data  MPHR 144 bpm    Percent HR 104 %    RPE 17     Estimated workload 7 METS       Nuclear Stress Measurements   LV sys vol  73 mL    TID 1.56     LV dias vol 133 mL    SSS 14     SRS 4     SDS 10          Nuclear Stress Findings   Isotope administration Rest isotope was administered with an IV injection of 9.6 mCi Tc88m Tetrofosmin. Rest SPECT images were obtained approximately 45 minutes post tracer injection. Stress isotope was administered with an IV injection of 31.0 mCi Tc42m Tetrofosmin at peak exercise. Stress SPECT images were obtained approximately 30 minutes post tracer injection.  Nuclear Study Quality Overall image quality is good.  Nuclear Measurements Study was gated.  Rest Perfusion There is a defect present in the apical septal and apex location.  Stress Perfusion There is a defect present in the basal anteroseptal, mid anteroseptal, apical anterior, apical septal and apex location.  Perfusion Summary Defect 1:  There is a large defect of severe  severity present in the basal anteroseptal, mid anteroseptal, apical anterior, apical septal and apex location. The defect is reversible.  Overall Study Impression Myocardial perfusion is abnormal. Findings consistent with ischemia and prior myocardial infarction. This is a high risk study. Overall left ventricular systolic function was abnormal. LV cavity size is normal. Nuclear stress EF: 45%. The left ventricular ejection fraction is mildly decreased (45-54%).   From: ACCF/SCAI/STS/AATS/AHA/ASNC/HFSA/SCCT 2012 Appropriate Use Criteria for Coronary Revascularization Focused Update  Wall Scoring   Score Index: 2.000 Percent Normal: 0.0%           The left ventricular wall motion is globally hypokinetic.            Signed Date/Time  Phone Pager  [place], PHILIP J            Cardiac Catheterization:   Accelerating angina pectoris with high risk myocardial perfusion study  Left main is widely patent.  Moderate calcification is noted in the left main and LAD.  99% proximal LAD, 90% ostial large first diagonal creating a Medina 111 bifurcation stenosis, second diagonal follows closely and will be in treatment zone if LAD stented.  70% apical LAD stenosis.  Second diagonal contains 50% narrowing.  Circumflex is codominant and the first obtuse marginal contains 60% proximal narrowing.  RCA contains 60% mid narrowing.  PDA supplies collaterals via septal perforators to the mid and distal LAD.  RECOMMENDATIONS:   After consideration, LIMA (? off-pump) to the LAD would be the patient's best and most are a long-term treatment option.  LAD stenting would likely close or further compromise the first diagonal and would also crossover the large second diagonal with risk of ostial narrowing.  Admit to hospital  IV heparin  IV nitroglycerin if pain or rest  TCTS consultation.   Recommendations   Antiplatelet/Anticoag Recommend Aspirin 81mg  daily for moderate CAD.  Surgeon Notes      08/30/2018 12:55 PM CV Procedure addendum by Belva Crome, MD  Indications   Coronary artery disease involving native coronary artery of native heart with unstable angina pectoris ([place]) [I25.110 (ICD-10-CM)]  Abnormal nuclear cardiac imaging test [R93.1 (ICD-10-CM)]  Procedural Details   Technical Details The right radial area was sterilely prepped and draped. Intravenous sedation with Versed and fentanyl was administered. 1% Xylocaine was infiltrated to achieve local analgesia. Using real-time vascular ultrasound, a double wall stick with an angiocath was utilized to obtain intra-arterial access. A VUS image was saved for the record.The modified Seldinger technique was used to place a 42F " Slender"  sheath in the right radial artery. Weight based heparin was administered. Coronary angiography was done using 5 F catheters. Right coronary angiography was performed with a JR4. Left ventricular hemodymic recordings and angiography was done using the JR 4 catheter and hand injection. Left coronary angiography was performed with a JL 3.5 cm.  Hemostasis was achieved using a pneumatic band.  During this procedure the patient is administered a total of Versed 1.5 mg and Fentanyl 50 mg to achieve and maintain moderate conscious sedation. The patient's heart rate, blood pressure, and oxygen saturation are monitored continuously during the procedure. The period of conscious sedation is 32 minutes, of which I was present face-to-face 100% of this time. Estimated blood loss <50 mL.   During this procedure medications were administered to achieve and maintain moderate conscious sedation while the patient's heart rate, blood pressure, and oxygen saturation were continuously monitored and I was present face-to-face 100% of this time.  Medications  (Filter: Administrations occurring from 08/30/18 1127 to 08/30/18 1247)  Medication Rate/Dose/Volume Action  Date Time   fentaNYL (SUBLIMAZE) injection (mcg) 25  mcg Given 08/30/18 1201   Total dose as of 08/30/18 1522 25 mcg Given 1206   50 mcg        midazolam (VERSED) injection (mg) 1 mg Given 08/30/18 1201   Total dose as of 08/30/18 1522 0.5 mg Given 1206   1.5 mg        lidocaine (PF) (XYLOCAINE) 1 % injection (mL) 2 mL Given 08/30/18 1205   Total dose as of 08/30/18 1522        2 mL        Radial Cocktail/Verapamil only (mL) 10 mL Given 08/30/18 1207   Total dose as of 08/30/18 1522        10 mL        heparin injection (Units) 5,000 Units Given 08/30/18 1208   Total dose as of 08/30/18 1522        5,000 Units        Heparin (Porcine) in NaCl 1000-0.9 UT/500ML-% SOLN (mL) 500 mL Given 08/30/18 1209   Total dose as of 08/30/18 1522 500 mL Given 1209   1,000 mL        iohexol (OMNIPAQUE) 350 MG/ML injection (mL) 125 mL Given 08/30/18 1237   Total dose as of 08/30/18 1522        125 mL        Sedation Time   Sedation Time Physician-1: 32 minutes 47 seconds  Coronary Findings   Diagnostic  Dominance: Co-dominant  Left Anterior Descending  Collaterals  Dist LAD filled by collaterals from RPDA.    Ost LAD to Prox LAD lesion 99% stenosed  Ost LAD to Prox LAD lesion is 99% stenosed.  Prox LAD to Mid LAD lesion 50% stenosed  Prox LAD to Mid LAD lesion is 50% stenosed.  Dist LAD lesion 75% stenosed  Dist LAD lesion is 75% stenosed.  First Diagonal Branch  Ost 1st Diag lesion 90% stenosed  Ost 1st Diag lesion is 90% stenosed.  First Septal Branch  Collaterals  1st Sept filled by collaterals from Inf Sept.    Second Diagonal Edmon Crape 2nd Diag lesion 70% stenosed  Ost 2nd Diag lesion is 70% stenosed.  Left Circumflex  First Obtuse Marginal Branch  Ost 1st Mrg lesion 65% stenosed  Ost 1st Mrg lesion is 65% stenosed.  Left Posterior Descending Artery  Right Coronary Artery  Prox RCA to Mid RCA lesion  60% stenosed  Prox RCA to Mid RCA lesion is 60% stenosed.  Intervention   No interventions have been documented.  Wall  Motion   Resting               Left Heart   Left Ventricle The left ventricular size is normal. The left ventricular systolic function is normal. LV end diastolic pressure is normal. The left ventricular ejection fraction is 55-65% by visual estimate.  Coronary Diagrams   Diagnostic  Dominance: Co-dominant    Intervention      Assessment / Plan:  Severe multivessel CAD with accelerating angina sx, normal EF by visual estimate on cath HTN Hyperlipidemia by report Remote smoker   For CABG     I  spent 60 minutes counseling the patient face to face.   John Giovanni, PA-C 08/30/2018 1:44 PM

## 2018-08-30 NOTE — Interval H&P Note (Signed)
Cath Lab Visit (complete for each Cath Lab visit)  Clinical Evaluation Leading to the Procedure:   ACS: No.  Non-ACS:    Anginal Classification: CCS III  Anti-ischemic medical therapy: Minimal Therapy (1 class of medications)  Non-Invasive Test Results: High-risk stress test findings: cardiac mortality >3%/year  Prior CABG: No previous CABG      History and Physical Interval Note:  08/30/2018 11:27 AM  Larry Roberts  has presented today for surgery, with the diagnosis of abnormal nuclear stress test.  The various methods of treatment have been discussed with the patient and family. After consideration of risks, benefits and other options for treatment, the patient has consented to  Procedure(s): LEFT HEART CATH AND CORONARY ANGIOGRAPHY (N/A) as a surgical intervention.  The patient's history has been reviewed, patient examined, no change in status, stable for surgery.  I have reviewed the patient's chart and labs.  Questions were answered to the patient's satisfaction.     Belva Crome III

## 2018-08-30 NOTE — CV Procedure (Addendum)
   Left heart cath via right radial using real-time vascular ultrasound for access.  High-grade proximal LAD, 99% with TIMI grade II flow.  Medina 111 bifurcation stenosis with a large first diagonal and treatment area would also include jailing of a large second diagonal.  The vessel contains moderate to heavy calcification.  Patent left main  First obtuse marginal 60% stenosis proximally  Codominant right coronary with 50 to 60% mid vessel narrowing.  Septal collaterals to LAD.  Normal LV function and LVEDP.  After consideration, decision made to perform LIMA to LAD as treatment of choice given the multiple branches that are present within the treatment region if stent is performed.  Clearly, first diagonal is large and would be at high risk for occlusion.  The second diagonal which is large and branching would also be jailed..  Perhaps LIMA could be done off-pump.  Diagonals appear to be large enough to bypass and therefore perhaps on-pump would be more complete revascularization.  We will get opinion from surgical colleagues.  Plan admit to hospital, start IV heparin, IV nitroglycerin if pain.

## 2018-08-30 NOTE — Progress Notes (Signed)
PHARMACIST - PHYSICIAN ORDER COMMUNICATION  CONCERNING: P&T Medication Policy on Herbal Medications  DESCRIPTION:  This patient's order for:  Co Q 10 and Osteo-Bioflex  Have been noted.  This product(s) is classified as an "herbal" or natural product. Due to a lack of definitive safety studies or FDA approval, nonstandard manufacturing practices, plus the potential risk of unknown drug-drug interactions while on inpatient medications, the Pharmacy and Therapeutics Committee does not permit the use of "herbal" or natural products of this type within [facility].   ACTION TAKEN: The pharmacy department is unable to verify this order at this time. Please reevaluate patient's clinical condition at discharge and address if the herbal or natural product(s) should be resumed at that time.  Consuello Masse, RPh 08/30/2018 4:23 PM

## 2018-08-30 NOTE — Progress Notes (Signed)
ANTICOAGULATION CONSULT NOTE - Initial Consult  Pharmacy Consult for Heparin Indication: CAD  Allergies  Allergen Reactions  . Levaquin [Levofloxacin] Itching    severe  . Tetanus Toxoids Other (See Comments)    "black out"    Patient Measurements: Height: 5\' 10"  (177.8 cm) Weight: 191 lb 12.8 oz (87 kg) IBW/kg (Calculated) : 73 Heparin Dosing Weight: 87 kg  Vital Signs: Temp: 98.2 F (36.8 C) (03/18 1330) Temp Source: Oral (03/18 1330) BP: 147/81 (03/18 1330) Pulse Rate: 57 (03/18 1330)  Labs: Recent Labs    08/29/18 1136  HGB 16.5  HCT 47.2  PLT 243  CREATININE 0.96    Estimated Creatinine Clearance: 67.6 mL/min (by C-G formula based on SCr of 0.96 mg/dL).   Medical History: Past Medical History:  Diagnosis Date  . Arthritis   . Carpal tunnel syndrome of left wrist   . Coronary artery disease   . GERD (gastroesophageal reflux disease)   . Glaucoma, both eyes   . Hx of colonic polyps   . Hypertension   . Nocturia   . Prostate cancer [facility]) urologist-  dr borden/  oncologist-- dr Tammi Klippel   dx 08/ 2018 Gleason 3+3, PSA 6.8 active surveillane/  dx 09-15-2017 Stage T2a, Gleason 3+4, PSA 3.74-- plan external beam radiation and ADT   Assessment:  78 yr old male with recent abnormal stress test now s/p cardiac cath, to begin IV heparin 8 hrs after sheath out. TCTS consulted for possible surgical option for CAD treatment.     Sheath out at 12:38pm. TR band currently at [place] air. RN reports some oozing with last deflation attempt, so only 1cc air removed.    Goal of Therapy:  Heparin level 0.3-0.7 units/ml Monitor platelets by anticoagulation protocol: Yes   Plan:   Heparin drip to begin ~9pm at 1150 units/hr.  Heparin level ~8 hrs after drip begins.  Daily heparin level and CBC while on heparin.  F/u TCTS consult and plans.  RN will report if problems with TR band deflation going forward.    Arty Baumgartner, [employer] Pager: [phone] or phone:  [phone] 08/30/2018,4:12 PM

## 2018-08-31 ENCOUNTER — Inpatient Hospital Stay (HOSPITAL_COMMUNITY): Payer: Medicare Other

## 2018-08-31 DIAGNOSIS — R079 Chest pain, unspecified: Secondary | ICD-10-CM

## 2018-08-31 DIAGNOSIS — I25110 Atherosclerotic heart disease of native coronary artery with unstable angina pectoris: Secondary | ICD-10-CM

## 2018-08-31 DIAGNOSIS — I25119 Atherosclerotic heart disease of native coronary artery with unspecified angina pectoris: Secondary | ICD-10-CM

## 2018-08-31 DIAGNOSIS — Z01810 Encounter for preprocedural cardiovascular examination: Secondary | ICD-10-CM

## 2018-08-31 LAB — PULMONARY FUNCTION TEST
DL/VA % PRED: 83 %
DL/VA: 3.29 ml/min/mmHg/L
DLCO COR % PRED: 81 %
DLCO cor: 20.39 ml/min/mmHg
DLCO unc % pred: 80 %
DLCO unc: 20.22 ml/min/mmHg
FEF 25-75 Post: 4.15 L/sec
FEF 25-75 Pre: 3.15 L/sec
FEF2575-%CHANGE-POST: 31 %
FEF2575-%PRED-POST: 190 %
FEF2575-%Pred-Pre: 144 %
FEV1-%Change-Post: 4 %
FEV1-%Pred-Post: 115 %
FEV1-%Pred-Pre: 110 %
FEV1-Post: 3.48 L
FEV1-Pre: 3.33 L
FEV1FVC-%Change-Post: 0 %
FEV1FVC-%Pred-Pre: 110 %
FEV6-%Change-Post: 5 %
FEV6-%Pred-Post: 110 %
FEV6-%Pred-Pre: 104 %
FEV6-Post: 4.34 L
FEV6-Pre: 4.11 L
FEV6FVC-%Change-Post: 0 %
FEV6FVC-%Pred-Post: 105 %
FEV6FVC-%Pred-Pre: 105 %
FVC-%Change-Post: 4 %
FVC-%Pred-Post: 104 %
FVC-%Pred-Pre: 99 %
FVC-PRE: 4.16 L
FVC-Post: 4.37 L
POST FEV1/FVC RATIO: 80 %
Post FEV6/FVC ratio: 99 %
Pre FEV1/FVC ratio: 80 %
Pre FEV6/FVC Ratio: 99 %
RV % pred: 123 %
RV: 3.17 L
TLC % pred: 105 %
TLC: 7.42 L

## 2018-08-31 LAB — CBC
HCT: 40.9 % (ref 39.0–52.0)
Hemoglobin: 14.3 g/dL (ref 13.0–17.0)
MCH: 30.6 pg (ref 26.0–34.0)
MCHC: 35 g/dL (ref 30.0–36.0)
MCV: 87.4 fL (ref 80.0–100.0)
Platelets: 193 10*3/uL (ref 150–400)
RBC: 4.68 MIL/uL (ref 4.22–5.81)
RDW: 12.3 % (ref 11.5–15.5)
WBC: 4.8 10*3/uL (ref 4.0–10.5)
nRBC: 0 % (ref 0.0–0.2)

## 2018-08-31 LAB — SURGICAL PCR SCREEN
MRSA, PCR: NEGATIVE
Staphylococcus aureus: NEGATIVE

## 2018-08-31 LAB — URINALYSIS, ROUTINE W REFLEX MICROSCOPIC
Bilirubin Urine: NEGATIVE
Glucose, UA: NEGATIVE mg/dL
Hgb urine dipstick: NEGATIVE
Ketones, ur: NEGATIVE mg/dL
Leukocytes,Ua: NEGATIVE
Nitrite: NEGATIVE
PH: 6 (ref 5.0–8.0)
Protein, ur: NEGATIVE mg/dL
Specific Gravity, Urine: 1.006 (ref 1.005–1.030)

## 2018-08-31 LAB — HEPARIN LEVEL (UNFRACTIONATED)
Heparin Unfractionated: 0.55 IU/mL (ref 0.30–0.70)
Heparin Unfractionated: 0.61 IU/mL (ref 0.30–0.70)

## 2018-08-31 LAB — ECHOCARDIOGRAM COMPLETE
Height: 70 in
Weight: 3049.6 oz

## 2018-08-31 LAB — BASIC METABOLIC PANEL
Anion gap: 8 (ref 5–15)
BUN: 10 mg/dL (ref 8–23)
CHLORIDE: 108 mmol/L (ref 98–111)
CO2: 23 mmol/L (ref 22–32)
Calcium: 9.5 mg/dL (ref 8.9–10.3)
Creatinine, Ser: 0.98 mg/dL (ref 0.61–1.24)
GFR calc Af Amer: >60 mL/min (ref >60)
GFR calc non Af Amer: >60 mL/min (ref >60)
Glucose, Bld: 94 mg/dL (ref 70–99)
Potassium: 3.3 mmol/L — ABNORMAL LOW (ref 3.5–5.1)
Sodium: 139 mmol/L (ref 135–145)

## 2018-08-31 LAB — ABO/RH: ABO/RH(D): A POS

## 2018-08-31 LAB — TYPE AND SCREEN
ABO/RH(D): A POS
Antibody Screen: NEGATIVE

## 2018-08-31 MED ORDER — SODIUM CHLORIDE 0.9 % IV SOLN
1.5000 g | INTRAVENOUS | Status: AC
  Administered 2018-09-01: .75 g via INTRAVENOUS
  Administered 2018-09-01: 1.5 g via INTRAVENOUS
  Filled 2018-08-31: qty 1.5

## 2018-08-31 MED ORDER — METOPROLOL TARTRATE 12.5 MG HALF TABLET
12.5000 mg | ORAL_TABLET | Freq: Once | ORAL | Status: AC
  Administered 2018-09-01: 12.5 mg via ORAL
  Filled 2018-08-31: qty 1

## 2018-08-31 MED ORDER — SODIUM CHLORIDE 0.9 % IV SOLN
750.0000 mg | INTRAVENOUS | Status: DC
  Filled 2018-08-31: qty 750

## 2018-08-31 MED ORDER — PLASMA-LYTE 148 IV SOLN
INTRAVENOUS | Status: AC
  Administered 2018-09-01: 500 mL
  Filled 2018-08-31: qty 2.5

## 2018-08-31 MED ORDER — CHLORHEXIDINE GLUCONATE CLOTH 2 % EX PADS
6.0000 | MEDICATED_PAD | Freq: Once | CUTANEOUS | Status: AC
  Administered 2018-09-01: 6 via TOPICAL

## 2018-08-31 MED ORDER — NITROGLYCERIN IN D5W 200-5 MCG/ML-% IV SOLN
2.0000 ug/min | INTRAVENOUS | Status: AC
  Administered 2018-09-01: 10 ug/min via INTRAVENOUS
  Filled 2018-08-31: qty 250

## 2018-08-31 MED ORDER — TRANEXAMIC ACID (OHS) PUMP PRIME SOLUTION
2.0000 mg/kg | INTRAVENOUS | Status: DC
  Filled 2018-08-31: qty 1.73

## 2018-08-31 MED ORDER — MAGNESIUM SULFATE 50 % IJ SOLN
40.0000 meq | INTRAMUSCULAR | Status: DC
  Filled 2018-08-31: qty 9.85

## 2018-08-31 MED ORDER — MILRINONE LACTATE IN DEXTROSE 20-5 MG/100ML-% IV SOLN
0.3000 ug/kg/min | INTRAVENOUS | Status: DC
  Filled 2018-08-31: qty 100

## 2018-08-31 MED ORDER — DIAZEPAM 5 MG PO TABS
5.0000 mg | ORAL_TABLET | Freq: Once | ORAL | Status: AC
  Administered 2018-09-01: 5 mg via ORAL
  Filled 2018-08-31: qty 1

## 2018-08-31 MED ORDER — ALBUTEROL SULFATE (2.5 MG/3ML) 0.083% IN NEBU
2.5000 mg | INHALATION_SOLUTION | Freq: Once | RESPIRATORY_TRACT | Status: AC
  Administered 2018-08-31: 2.5 mg via RESPIRATORY_TRACT

## 2018-08-31 MED ORDER — TRANEXAMIC ACID (OHS) BOLUS VIA INFUSION
15.0000 mg/kg | INTRAVENOUS | Status: DC
  Filled 2018-08-31: qty 1298

## 2018-08-31 MED ORDER — POTASSIUM CHLORIDE 2 MEQ/ML IV SOLN
80.0000 meq | INTRAVENOUS | Status: DC
  Filled 2018-08-31: qty 40

## 2018-08-31 MED ORDER — VANCOMYCIN HCL 10 G IV SOLR
1500.0000 mg | INTRAVENOUS | Status: AC
  Administered 2018-09-01: 1500 mg via INTRAVENOUS
  Filled 2018-08-31: qty 1500

## 2018-08-31 MED ORDER — SODIUM CHLORIDE 0.9 % IV SOLN
INTRAVENOUS | Status: DC
  Filled 2018-08-31: qty 30

## 2018-08-31 MED ORDER — MAGNESIUM HYDROXIDE 400 MG/5ML PO SUSP
30.0000 mL | Freq: Every day | ORAL | Status: DC | PRN
  Administered 2018-08-31: 30 mL via ORAL
  Filled 2018-08-31: qty 30

## 2018-08-31 MED ORDER — DOPAMINE-DEXTROSE 3.2-5 MG/ML-% IV SOLN
0.0000 ug/kg/min | INTRAVENOUS | Status: DC
  Filled 2018-08-31: qty 250

## 2018-08-31 MED ORDER — PHENYLEPHRINE HCL-NACL 20-0.9 MG/250ML-% IV SOLN
30.0000 ug/min | INTRAVENOUS | Status: DC
  Filled 2018-08-31: qty 250

## 2018-08-31 MED ORDER — EPINEPHRINE PF 1 MG/ML IJ SOLN
0.0000 ug/min | INTRAVENOUS | Status: DC
  Filled 2018-08-31: qty 4

## 2018-08-31 MED ORDER — CHLORHEXIDINE GLUCONATE 0.12 % MT SOLN
15.0000 mL | Freq: Once | OROMUCOSAL | Status: AC
  Administered 2018-09-01: 15 mL via OROMUCOSAL
  Filled 2018-08-31: qty 15

## 2018-08-31 MED ORDER — CHLORHEXIDINE GLUCONATE CLOTH 2 % EX PADS
6.0000 | MEDICATED_PAD | Freq: Once | CUTANEOUS | Status: AC
  Administered 2018-08-31: 6 via TOPICAL

## 2018-08-31 MED ORDER — NOREPINEPHRINE 4 MG/250ML-% IV SOLN
0.0000 ug/min | INTRAVENOUS | Status: DC
  Filled 2018-08-31: qty 250

## 2018-08-31 MED ORDER — INSULIN REGULAR(HUMAN) IN NACL 100-0.9 UT/100ML-% IV SOLN
INTRAVENOUS | Status: AC
  Administered 2018-09-01: .9 [IU]/h via INTRAVENOUS
  Filled 2018-08-31: qty 100

## 2018-08-31 MED ORDER — TRANEXAMIC ACID 1000 MG/10ML IV SOLN
1.5000 mg/kg/h | INTRAVENOUS | Status: AC
  Administered 2018-09-01: 15 mg/kg/h via INTRAVENOUS
  Filled 2018-08-31: qty 25

## 2018-08-31 MED ORDER — DEXMEDETOMIDINE HCL IN NACL 400 MCG/100ML IV SOLN
0.1000 ug/kg/h | INTRAVENOUS | Status: AC
  Administered 2018-09-01: .2 ug/kg/h via INTRAVENOUS
  Filled 2018-08-31: qty 100

## 2018-08-31 MED ORDER — BISACODYL 5 MG PO TBEC
5.0000 mg | DELAYED_RELEASE_TABLET | Freq: Once | ORAL | Status: DC
  Filled 2018-08-31: qty 1

## 2018-08-31 MED ORDER — POTASSIUM CHLORIDE CRYS ER 20 MEQ PO TBCR
30.0000 meq | EXTENDED_RELEASE_TABLET | ORAL | Status: AC
  Administered 2018-08-31 (×2): 30 meq via ORAL
  Filled 2018-08-31 (×2): qty 1

## 2018-08-31 MED ORDER — TEMAZEPAM 7.5 MG PO CAPS: 15.0000 mg | ORAL_CAPSULE | Freq: Once | ORAL | Status: DC | PRN

## 2018-08-31 NOTE — Progress Notes (Addendum)
[facility]     Patient Name: Larry Roberts Date of Encounter: 08/31/2018  Primary Cardiologist: Dr. Melody Comas Problem List     Principal Problem:   Angina pectoris, crescendo Larry Roberts) Active Problems:   Malignant neoplasm of prostate (Larry Roberts)   Abnormal nuclear stress test   Crescendo angina (Larry Roberts)     Subjective   Patient sitting up in recliner, states he feels great. No complaints at this time. Scheduled for CABG 09/01/2018.   Inpatient Medications    Scheduled Meds: . ALPRAZolam  0.25 mg Oral QHS  . amLODipine  5 mg Oral Daily  . aspirin  81 mg Oral Daily  . dorzolamide-timolol  1 drop Both Eyes BID  . famotidine  20 mg Oral Daily  . [START ON 09/01/2018] heparin-papaverine-plasmalyte irrigation   Irrigation To OR  . [START ON 09/01/2018] insulin   Intravenous To OR  . latanoprost  1 drop Both Eyes QHS  . [START ON 09/01/2018] magnesium sulfate  40 mEq Other To OR  . metoprolol succinate  50 mg Oral Daily  . [START ON 09/01/2018] phenylephrine  30-200 mcg/min Intravenous To OR  . potassium chloride  30 mEq Oral Q4H  . [START ON 09/01/2018] potassium chloride  80 mEq Other To OR  . rosuvastatin  40 mg Oral QHS  . sodium chloride flush  3 mL Intravenous Q12H  . tamsulosin  0.4 mg Oral Q72H  . [START ON 09/01/2018] tranexamic acid  15 mg/kg Intravenous To OR  . [START ON 09/01/2018] tranexamic acid  2 mg/kg Intracatheter To OR   Continuous Infusions: . sodium chloride    . [START ON 09/01/2018] cefUROXime (ZINACEF)  IV    . [START ON 09/01/2018] cefUROXime (ZINACEF)  IV    . [START ON 09/01/2018] dexmedetomidine    . [START ON 09/01/2018] DOPamine    . [START ON 09/01/2018] epinephrine    . [START ON 09/01/2018] heparin 30,000 units/NS 1000 mL solution for CELLSAVER    . heparin 1,150 Units/hr (08/31/18 0300)  . [START ON 09/01/2018] milrinone    . [START ON 09/01/2018] nitroGLYCERIN    . [START ON 09/01/2018] norepinephrine    . [START ON 09/01/2018]  tranexamic acid (CYKLOKAPRON) infusion ([facility])    . [START ON 09/01/2018] vancomycin     PRN Meds: sodium chloride, acetaminophen, ALPRAZolam, magnesium hydroxide, ondansetron (ZOFRAN) IV, oxyCODONE, polyvinyl alcohol, sodium chloride flush   Vital Signs    Vitals:   08/30/18 1617 08/30/18 2145 08/31/18 0703 08/31/18 1039  BP: 127/74 121/75 117/74 130/78  Pulse:  60 72 74  Resp:      Temp:  97.7 F (36.5 C)    TempSrc:      SpO2:  97% 98%   Weight:   86.5 kg   Height:        Intake/Output Summary (Last 24 hours) at 08/31/2018 1050 Last data filed at 08/31/2018 0300 Gross per 24 hour  Intake 562.12 ml  Output -  Net 562.12 ml   Filed Weights   08/30/18 1021 08/30/18 1330 08/31/18 0703  Weight: 88.5 kg 87 kg 86.5 kg    Physical Exam   GEN: Well nourished, well developed, in no acute distress.  HEENT: Grossly normal.  Neck: Supple, no JVD, carotid bruits, or masses. Cardiac: RRR, no murmurs, rubs, or gallops. No clubbing, cyanosis, edema.  Radials/DP/PT 2+ and equal bilaterally.  Respiratory:  Respirations regular and unlabored, clear to auscultation bilaterally. GI: Soft, nontender, nondistended, BS +  x 4. MS: no deformity or atrophy. Skin: warm and dry, no rash. Right radial site Level Zero, no pain or tenderness.  Neuro:  Strength and sensation are intact. Psych: AAOx3.  Normal affect.  Labs    CBC Recent Labs    08/29/18 1136 08/31/18 0516  WBC 6.4 4.8  HGB 16.5 14.3  HCT 47.2 40.9  MCV 89 87.4  PLT 243 244   Basic Metabolic Panel Recent Labs    08/29/18 1136 08/31/18 0516  NA 142 139  K 4.6 3.3*  CL 105 108  CO2 26 23  GLUCOSE 118* 94  BUN 11 10  CREATININE 0.96 0.98  CALCIUM 10.3* 9.5     Telemetry    Sinus Rhythm - Personally Reviewed  ECG    08/30/2018: Sinus bradycardia Left anterior fascicular block Abnormal ECG Since previous tracing Left anterior fasicular block - Personally Reviewed  Radiology    No results found.  Cardiac  Studies    Dist LAD lesion is 75% stenosed.  Ost 2nd Diag lesion is 70% stenosed.    Accelerating angina pectoris with high risk myocardial perfusion study  Left main is widely patent.  Moderate calcification is noted in the left main and LAD.  99% proximal LAD, 90% ostial large first diagonal creating a [place] 111 bifurcation stenosis, second diagonal follows closely and will be in treatment zone if LAD stented.  70% apical LAD stenosis.  Second diagonal contains 50% narrowing.  Circumflex is codominant and the first obtuse marginal contains 60% proximal narrowing.  RCA contains 60% mid narrowing.  PDA supplies collaterals via septal perforators to the mid and distal LAD.  RECOMMENDATIONS:   After consideration, LIMA (? off-pump) to the LAD would be the patient's best and most are a long-term treatment option.  LAD stenting would likely close or further compromise the first diagonal and would also crossover the large second diagonal with risk of ostial narrowing.  Admit to hospital  IV heparin  IV nitroglycerin if pain or rest  TCTS consultation.  Patient Profile     78 year old male with a history of prostate cancer, glaucoma, hypertension, and GERD admitted to the hospital at the request of primary cardiologist Dr. Lysle Rubens for evaluation of chest pain. 08/30/2018 Left heart cath shows multivessel disease. Patient scheduled for CABG 09/01/2018  Assessment & Plan    CAD: Patient with multivessel disease, angina and ECG changes demonstrating ischemia. Patient currently without chest pain or activity limitations. Plan: CABG by Cardiothoracic team 09/01/2018 Surgical orders per Cardiothoracic team Continue IV heparin Continue Aspirin 81 mg daily   Hypertension: Current blood pressure 117/74 Plan:  Continue Amlodipine 5 mg daily Continue Metoprolol XL 50 mg daily  GERD Plan: Continue Pepcid 20 mg daily  Hyperlipidemia: Plan: Continue Crestor 40 mg daily    Hypokalemia: Plan: Oral potassium ordered - 30 meq x2  Prostate Cancer: History of - in active surveillance.  Plan: Continue Flomax 0.4 mg daily     Signed, Theresa Duty, Student NP  08/31/2018, 10:50 AM    Doing well without chest pain overnight. Cardiac rehab at the bedside providing surgery education. Medication regimen with ASA, statin, BB, and amlodipine. Remains on IV heparin. Cath with 99% pLAD lesion with 90% ostial 1st diag. TCTS consulted and planned for CABG in the am. K+ supplemented.   Barnet Pall, NP-C 08/31/2018, 11:42 AM Pager: [phone]  Patient seen, examined. Available data reviewed. Agree with findings, assessment, and plan as outlined by Soyla Dryer  and Reino Bellis, NP-C.  The patient is alert and oriented in no distress.  He has had no recurrent chest discomfort, maintained on IV heparin.  Plans for CABG tomorrow morning.  Labs are reviewed.  Medications reviewed and will be continued without change as above.  Will follow postoperatively.  Sherren Mocha, M.D. 08/31/2018 12:48 PM

## 2018-08-31 NOTE — Progress Notes (Signed)
  Echocardiogram 2D Echocardiogram has been performed.  Larry Roberts 08/31/2018, 3:44 PM

## 2018-08-31 NOTE — Progress Notes (Signed)
Pre-CABG Dopplers completed. Refer to "CV Proc" under chart review to view preliminary results.  08/31/2018 1:09 PM Maudry Mayhew, MHA, RVT, RDCS, RDMS

## 2018-08-31 NOTE — Progress Notes (Signed)
ANTICOAGULATION CONSULT NOTE - Follow-up Consult  Pharmacy Consult for Heparin Indication: severe multivessel CAD  Allergies  Allergen Reactions  . Tetanus Toxoids Other (See Comments)    SYNCOPE "black out"  . Levaquin [Levofloxacin] Itching    Patient Measurements: Height: 5\' 10"  (177.8 cm) Weight: 190 lb 9.6 oz (86.5 kg) IBW/kg (Calculated) : 73 Heparin Dosing Weight: 87 kg  Vital Signs: Temp: 97.7 F (36.5 C) (03/19 1245) Temp Source: Oral (03/19 1245) BP: 131/88 (03/19 1245) Pulse Rate: 69 (03/19 1245)  Labs: Recent Labs    08/29/18 1136 08/31/18 0516 08/31/18 1231  HGB 16.5 14.3  --   HCT 47.2 40.9  --   PLT 243 193  --   HEPARINUNFRC  --  0.55 0.61  CREATININE 0.96 0.98  --     Estimated Creatinine Clearance: 66.2 mL/min (by C-G formula based on SCr of 0.98 mg/dL).   Assessment:  78 yr old male with recent abnormal stress test now s/p cardiac cath, to begin IV heparin 8 hrs after sheath out. TCTS plans for CABG for severe multivessel CAD. Plans noted for OR on 2/20 -Heparin level therapeutic (0.61) on gtt at 1150 units/hr.   Goal of Therapy:  Heparin level 0.3-0.7 units/ml Monitor platelets by anticoagulation protocol: Yes   Plan:  Continue heparin at 1150 units/hr. Daily heparin level and CBC  Hildred Laser, PharmD Clinical Pharmacist **Pharmacist phone directory can now be found on [email or web address] ([email or web address]).  Listed under [place].

## 2018-08-31 NOTE — Progress Notes (Signed)
CARDIAC REHAB PHASE I   PRE:  Rate/Rhythm: 72 SR  BP:  Supine:   Sitting: 138/77  Standing:    SaO2: 98%RA  MODE:  Ambulation: 470 ft   POST:  Rate/Rhythm: 79 SR  BP:  Supine:   Sitting: 150/74  Standing:    SaO2: 97%RA 1035-1115 Pt walked 470 ft on RA with steady gait. Tolerated well. Pt demonstrated 2500 ml on IS correctly. Gave [facility] booklet and care guide and in the tube handout. Discussed staying in the tube and sternal precautions. Wrote down how to view pre op video. Pt will have wife available after discharge to assist with care.   Graylon Good, RN BSN  08/31/2018 11:12 AM

## 2018-08-31 NOTE — Progress Notes (Signed)
ANTICOAGULATION CONSULT NOTE - Follow-up Consult  Pharmacy Consult for Heparin Indication: severe multivessel CAD  Allergies  Allergen Reactions  . Levaquin [Levofloxacin] Itching    severe  . Tetanus Toxoids Other (See Comments)    "black out"    Patient Measurements: Height: 5\' 10"  (177.8 cm) Weight: 191 lb 12.8 oz (87 kg) IBW/kg (Calculated) : 73 Heparin Dosing Weight: 87 kg  Vital Signs: Temp: 97.7 F (36.5 C) (03/18 2145) BP: 121/75 (03/18 2145) Pulse Rate: 60 (03/18 2145)  Labs: Recent Labs    08/29/18 1136 08/31/18 0516  HGB 16.5 14.3  HCT 47.2 40.9  PLT 243 193  HEPARINUNFRC  --  0.55  CREATININE 0.96  --     Estimated Creatinine Clearance: 67.6 mL/min (by C-G formula based on SCr of 0.96 mg/dL).   Assessment:  78 yr old male with recent abnormal stress test now s/p cardiac cath, to begin IV heparin 8 hrs after sheath out. TCTS plans for CABG for severe multivessel CAD.  Heparin level therapeutic (0.55) on gtt at 1150 units/hr. No bleeding noted, CBC wnl.  Goal of Therapy:  Heparin level 0.3-0.7 units/ml Monitor platelets by anticoagulation protocol: Yes   Plan:  Continue heparin at 1150 units/hr. Will f/u 6 hr confirmatory heparin level    Sherlon Handing, PharmD, BCPS Clinical pharmacist  **Pharmacist phone directory can now be found on [email or web address] ([email or web address]).  Listed under [place]. 08/31/2018,6:07 AM

## 2018-08-31 NOTE — Progress Notes (Signed)
1 Day Post-Op Procedure(s) (LRB): LEFT HEART CATH AND CORONARY ANGIOGRAPHY (N/A) Subjective: No complaints. Ambulated in halls without chest pain or SOB. No BM today but took MOM this afternoon.  Objective: Vital signs in last 24 hours: Temp:  [97.7 F (36.5 C)] 97.7 F (36.5 C) (03/19 1245) Pulse Rate:  [60-74] 69 (03/19 1245) Cardiac Rhythm: Normal sinus rhythm (03/19 1040) Resp:  [18] 18 (03/19 1245) BP: (117-131)/(74-88) 131/88 (03/19 1245) SpO2:  [97 %-98 %] 98 % (03/19 1245) Weight:  [86.5 kg] 86.5 kg (03/19 0703)  Hemodynamic parameters for last 24 hours:    Intake/Output from previous day: 03/18 0701 - 03/19 0700 In: 562.1 [I.V.:562.1] Out: -  Intake/Output this shift: Total I/O In: 600 [P.O.:600] Out: -   Looks comfortable.  Lab Results: Recent Labs    08/29/18 1136 08/31/18 0516  WBC 6.4 4.8  HGB 16.5 14.3  HCT 47.2 40.9  PLT 243 193   BMET:  Recent Labs    08/29/18 1136 08/31/18 0516  NA 142 139  K 4.6 3.3*  CL 105 108  CO2 26 23  GLUCOSE 118* 94  BUN 11 10  CREATININE 0.96 0.98  CALCIUM 10.3* 9.5    PT/INR: No results for input(s): LABPROT, INR in the last 72 hours. ABG No results found for: PHART, HCO3, TCO2, ACIDBASEDEF, O2SAT CBG (last 3)  No results for input(s): GLUCAP in the last 72 hours.  Assessment/Plan: S/P Procedure(s) (LRB): LEFT HEART CATH AND CORONARY ANGIOGRAPHY (N/A)  High grade proximal LAD/Diagonal stenoses. Plan CABG in am. Patient has no further questions.   LOS: 1 day    Gaye Pollack 08/31/2018

## 2018-09-01 ENCOUNTER — Inpatient Hospital Stay (HOSPITAL_COMMUNITY): Payer: Medicare Other | Admitting: Certified Registered Nurse Anesthetist

## 2018-09-01 ENCOUNTER — Inpatient Hospital Stay (HOSPITAL_COMMUNITY)
Admission: RE | Disposition: A | Discharge status: Home / Self Care | Payer: Self-pay | Source: Home / Self Care | Attending: Surgery

## 2018-09-01 ENCOUNTER — Inpatient Hospital Stay (HOSPITAL_COMMUNITY): Payer: Medicare Other

## 2018-09-01 DIAGNOSIS — I25110 Atherosclerotic heart disease of native coronary artery with unstable angina pectoris: Secondary | ICD-10-CM

## 2018-09-01 DIAGNOSIS — R9439 Abnormal result of other cardiovascular function study: Secondary | ICD-10-CM

## 2018-09-01 DIAGNOSIS — Z951 Presence of aortocoronary bypass graft: Secondary | ICD-10-CM

## 2018-09-01 HISTORY — PX: TEE WITHOUT CARDIOVERSION: SHX5443

## 2018-09-01 HISTORY — PX: CORONARY ARTERY BYPASS GRAFT: SHX141

## 2018-09-01 LAB — CBC
HCT: 44.2 % (ref 39.0–52.0)
HCT: 31.3 % — ABNORMAL LOW (ref 39.0–52.0)
HCT: 37.4 % — ABNORMAL LOW (ref 39.0–52.0)
Hemoglobin: 15.4 g/dL (ref 13.0–17.0)
Hemoglobin: 11 g/dL — ABNORMAL LOW (ref 13.0–17.0)
Hemoglobin: 12.8 g/dL — ABNORMAL LOW (ref 13.0–17.0)
MCH: 30.7 pg (ref 26.0–34.0)
MCH: 31.5 pg (ref 26.0–34.0)
MCH: 30.8 pg (ref 26.0–34.0)
MCHC: 34.8 g/dL (ref 30.0–36.0)
MCHC: 35.1 g/dL (ref 30.0–36.0)
MCHC: 34.2 g/dL (ref 30.0–36.0)
MCV: 88 fL (ref 80.0–100.0)
MCV: 89.7 fL (ref 80.0–100.0)
MCV: 89.9 fL (ref 80.0–100.0)
Platelets: 227 10*3/uL (ref 150–400)
Platelets: 104 10*3/uL — ABNORMAL LOW (ref 150–400)
Platelets: 138 10*3/uL — ABNORMAL LOW (ref 150–400)
RBC: 5.02 MIL/uL (ref 4.22–5.81)
RBC: 3.49 MIL/uL — ABNORMAL LOW (ref 4.22–5.81)
RBC: 4.16 MIL/uL — ABNORMAL LOW (ref 4.22–5.81)
RDW: 12.3 % (ref 11.5–15.5)
RDW: 12.4 % (ref 11.5–15.5)
RDW: 12.3 % (ref 11.5–15.5)
WBC: 5.3 10*3/uL (ref 4.0–10.5)
WBC: 5.3 10*3/uL (ref 4.0–10.5)
WBC: 9.6 10*3/uL (ref 4.0–10.5)
nRBC: 0 % (ref 0.0–0.2)
nRBC: 0 % (ref 0.0–0.2)
nRBC: 0 % (ref 0.0–0.2)

## 2018-09-01 LAB — BASIC METABOLIC PANEL
ANION GAP: 7 (ref 5–15)
Anion gap: 7 (ref 5–15)
BUN: 11 mg/dL (ref 8–23)
BUN: 9 mg/dL (ref 8–23)
CO2: 22 mmol/L (ref 22–32)
CO2: 21 mmol/L — ABNORMAL LOW (ref 22–32)
Calcium: 9.8 mg/dL (ref 8.9–10.3)
Calcium: 8.6 mg/dL — ABNORMAL LOW (ref 8.9–10.3)
Chloride: 111 mmol/L (ref 98–111)
Chloride: 111 mmol/L (ref 98–111)
Creatinine, Ser: 1.01 mg/dL (ref 0.61–1.24)
Creatinine, Ser: 0.98 mg/dL (ref 0.61–1.24)
GFR calc Af Amer: >60 mL/min (ref >60)
GFR calc Af Amer: >60 mL/min (ref >60)
GFR calc non Af Amer: >60 mL/min (ref >60)
Glucose, Bld: 107 mg/dL — ABNORMAL HIGH (ref 70–99)
Glucose, Bld: 140 mg/dL — ABNORMAL HIGH (ref 70–99)
Potassium: 4 mmol/L (ref 3.5–5.1)
Potassium: 4.1 mmol/L (ref 3.5–5.1)
Sodium: 140 mmol/L (ref 135–145)
Sodium: 139 mmol/L (ref 135–145)

## 2018-09-01 LAB — MAGNESIUM: Magnesium: 2.4 mg/dL (ref 1.7–2.4)

## 2018-09-01 LAB — POCT I-STAT 7, (LYTES, BLD GAS, ICA,H+H)
Acid-base deficit: 1 mmol/L (ref 0.0–2.0)
Bicarbonate: 23.6 mmol/L (ref 20.0–28.0)
Calcium, Ion: 1.08 mmol/L — ABNORMAL LOW (ref 1.15–1.40)
HCT: 27 % — ABNORMAL LOW (ref 39.0–52.0)
Hemoglobin: 9.2 g/dL — ABNORMAL LOW (ref 13.0–17.0)
O2 Saturation: 100 %
Potassium: 4.3 mmol/L (ref 3.5–5.1)
Sodium: 143 mmol/L (ref 135–145)
TCO2: 25 mmol/L (ref 22–32)
pCO2 arterial: 39.2 mmHg (ref 32.0–48.0)
pH, Arterial: 7.387 (ref 7.350–7.450)
pO2, Arterial: 368 mmHg — ABNORMAL HIGH (ref 83.0–108.0)

## 2018-09-01 LAB — POCT I-STAT 4, (NA,K, GLUC, HGB,HCT)
Glucose, Bld: 105 mg/dL — ABNORMAL HIGH (ref 70–99)
Glucose, Bld: 108 mg/dL — ABNORMAL HIGH (ref 70–99)
Glucose, Bld: 103 mg/dL — ABNORMAL HIGH (ref 70–99)
Glucose, Bld: 94 mg/dL (ref 70–99)
HCT: 36 % — ABNORMAL LOW (ref 39.0–52.0)
HCT: 35 % — ABNORMAL LOW (ref 39.0–52.0)
HCT: 26 % — ABNORMAL LOW (ref 39.0–52.0)
HCT: 27 % — ABNORMAL LOW (ref 39.0–52.0)
HEMOGLOBIN: 9.2 g/dL — AB (ref 13.0–17.0)
Hemoglobin: 12.2 g/dL — ABNORMAL LOW (ref 13.0–17.0)
Hemoglobin: 11.9 g/dL — ABNORMAL LOW (ref 13.0–17.0)
Hemoglobin: 8.8 g/dL — ABNORMAL LOW (ref 13.0–17.0)
POTASSIUM: 4.2 mmol/L (ref 3.5–5.1)
Potassium: 4 mmol/L (ref 3.5–5.1)
Potassium: 4.2 mmol/L (ref 3.5–5.1)
Potassium: 4.8 mmol/L (ref 3.5–5.1)
SODIUM: 142 mmol/L (ref 135–145)
Sodium: 142 mmol/L (ref 135–145)
Sodium: 140 mmol/L (ref 135–145)
Sodium: 142 mmol/L (ref 135–145)

## 2018-09-01 LAB — GLUCOSE, CAPILLARY
Glucose-Capillary: 106 mg/dL — ABNORMAL HIGH (ref 70–99)
Glucose-Capillary: 110 mg/dL — ABNORMAL HIGH (ref 70–99)
Glucose-Capillary: 88 mg/dL (ref 70–99)
Glucose-Capillary: 113 mg/dL — ABNORMAL HIGH (ref 70–99)
Glucose-Capillary: 121 mg/dL — ABNORMAL HIGH (ref 70–99)
Glucose-Capillary: 127 mg/dL — ABNORMAL HIGH (ref 70–99)

## 2018-09-01 LAB — APTT: aPTT: 32 seconds (ref 24–36)

## 2018-09-01 LAB — HEMOGLOBIN AND HEMATOCRIT, BLOOD
HCT: 29.5 % — ABNORMAL LOW (ref 39.0–52.0)
HEMOGLOBIN: 9.9 g/dL — AB (ref 13.0–17.0)

## 2018-09-01 LAB — PLATELET COUNT: Platelets: 128 10*3/uL — ABNORMAL LOW (ref 150–400)

## 2018-09-01 LAB — PROTIME-INR
INR: 1.5 — ABNORMAL HIGH (ref 0.8–1.2)
Prothrombin Time: 17.5 seconds — ABNORMAL HIGH (ref 11.4–15.2)

## 2018-09-01 LAB — HEPARIN LEVEL (UNFRACTIONATED): Heparin Unfractionated: 0.82 IU/mL — ABNORMAL HIGH (ref 0.30–0.70)

## 2018-09-01 SURGERY — CORONARY ARTERY BYPASS GRAFTING (CABG)
Anesthesia: General | Site: Esophagus

## 2018-09-01 MED ORDER — PROTAMINE SULFATE 10 MG/ML IV SOLN
INTRAVENOUS | Status: AC
  Filled 2018-09-01: qty 5

## 2018-09-01 MED ORDER — THROMBIN 20000 UNITS EX SOLR
CUTANEOUS | Status: DC | PRN
  Administered 2018-09-01: 20000 [IU] via TOPICAL

## 2018-09-01 MED ORDER — VANCOMYCIN HCL IN DEXTROSE 1-5 GM/200ML-% IV SOLN
1000.0000 mg | Freq: Once | INTRAVENOUS | Status: AC
  Administered 2018-09-01: 1000 mg via INTRAVENOUS
  Filled 2018-09-01: qty 200

## 2018-09-01 MED ORDER — METOPROLOL TARTRATE 12.5 MG HALF TABLET
12.5000 mg | ORAL_TABLET | Freq: Two times a day (BID) | ORAL | Status: DC
  Administered 2018-09-02 – 2018-09-06 (×8): 12.5 mg via ORAL
  Filled 2018-09-01 (×9): qty 1

## 2018-09-01 MED ORDER — ONDANSETRON HCL 4 MG/2ML IJ SOLN
INTRAMUSCULAR | Status: DC | PRN
  Administered 2018-09-01: 4 mg via INTRAVENOUS

## 2018-09-01 MED ORDER — SODIUM CHLORIDE 0.9 % IV SOLN: 1.5000 g | Freq: Two times a day (BID) | INTRAVENOUS | Status: DC

## 2018-09-01 MED ORDER — SODIUM CHLORIDE 0.9% FLUSH: 3.0000 mL | INTRAVENOUS | Status: DC | PRN

## 2018-09-01 MED ORDER — PROPOFOL 10 MG/ML IV BOLUS
INTRAVENOUS | Status: DC | PRN
  Administered 2018-09-01 (×2): 10 mg via INTRAVENOUS
  Administered 2018-09-01: 30 mg via INTRAVENOUS

## 2018-09-01 MED ORDER — NITROGLYCERIN IN D5W 200-5 MCG/ML-% IV SOLN: 0.0000 ug/min | INTRAVENOUS | Status: DC

## 2018-09-01 MED ORDER — TRAMADOL HCL 50 MG PO TABS
50.0000 mg | ORAL_TABLET | ORAL | Status: DC | PRN
  Administered 2018-09-01 – 2018-09-02 (×2): 100 mg via ORAL
  Filled 2018-09-01 (×2): qty 2

## 2018-09-01 MED ORDER — SODIUM CHLORIDE 0.9% FLUSH: 10.0000 mL | INTRAVENOUS | Status: DC | PRN

## 2018-09-01 MED ORDER — METOPROLOL TARTRATE 5 MG/5ML IV SOLN: 2.5000 mg | INTRAVENOUS | Status: DC | PRN

## 2018-09-01 MED ORDER — PROPOFOL 10 MG/ML IV BOLUS
INTRAVENOUS | Status: AC
  Filled 2018-09-01: qty 20

## 2018-09-01 MED ORDER — CHLORHEXIDINE GLUCONATE CLOTH 2 % EX PADS
6.0000 | MEDICATED_PAD | Freq: Every day | CUTANEOUS | Status: DC
  Administered 2018-09-01 – 2018-09-02 (×2): 6 via TOPICAL

## 2018-09-01 MED ORDER — ARTIFICIAL TEARS OPHTHALMIC OINT
TOPICAL_OINTMENT | OPHTHALMIC | Status: DC | PRN
  Administered 2018-09-01: 1 via OPHTHALMIC

## 2018-09-01 MED ORDER — ONDANSETRON HCL 4 MG/2ML IJ SOLN
INTRAMUSCULAR | Status: AC
  Filled 2018-09-01: qty 2

## 2018-09-01 MED ORDER — INSULIN REGULAR(HUMAN) IN NACL 100-0.9 UT/100ML-% IV SOLN: INTRAVENOUS | Status: DC

## 2018-09-01 MED ORDER — HEPARIN SODIUM (PORCINE) 1000 UNIT/ML IJ SOLN
INTRAMUSCULAR | Status: DC | PRN
  Administered 2018-09-01: 28000 [IU] via INTRAVENOUS

## 2018-09-01 MED ORDER — OXYCODONE HCL 5 MG PO TABS: 5.0000 mg | ORAL_TABLET | ORAL | Status: DC | PRN

## 2018-09-01 MED ORDER — PHENYLEPHRINE 40 MCG/ML (10ML) SYRINGE FOR IV PUSH (FOR BLOOD PRESSURE SUPPORT)
PREFILLED_SYRINGE | INTRAVENOUS | Status: AC
  Filled 2018-09-01: qty 10

## 2018-09-01 MED ORDER — LACTATED RINGERS IV SOLN
INTRAVENOUS | Status: DC | PRN
  Administered 2018-09-01 (×2): via INTRAVENOUS

## 2018-09-01 MED ORDER — LACTATED RINGERS IV SOLN: 500.0000 mL | Freq: Once | INTRAVENOUS | Status: DC | PRN

## 2018-09-01 MED ORDER — SODIUM CHLORIDE 0.9% FLUSH: 3.0000 mL | Freq: Two times a day (BID) | INTRAVENOUS | Status: DC

## 2018-09-01 MED ORDER — ACETAMINOPHEN 160 MG/5ML PO SOLN: 1000.0000 mg | Freq: Four times a day (QID) | ORAL | Status: DC

## 2018-09-01 MED ORDER — BISACODYL 10 MG RE SUPP: 10.0000 mg | Freq: Every day | RECTAL | Status: DC

## 2018-09-01 MED ORDER — PHENYLEPHRINE HCL 10 MG/ML IJ SOLN
INTRAMUSCULAR | Status: DC | PRN
  Administered 2018-09-01 (×2): 40 ug via INTRAVENOUS

## 2018-09-01 MED ORDER — ALBUMIN HUMAN 5 % IV SOLN
INTRAVENOUS | Status: DC | PRN
  Administered 2018-09-01 (×3): via INTRAVENOUS

## 2018-09-01 MED ORDER — FENTANYL CITRATE (PF) 250 MCG/5ML IJ SOLN
INTRAMUSCULAR | Status: AC
  Filled 2018-09-01: qty 25

## 2018-09-01 MED ORDER — METOPROLOL TARTRATE 25 MG/10 ML ORAL SUSPENSION
12.5000 mg | Freq: Two times a day (BID) | ORAL | Status: DC
  Filled 2018-09-01 (×6): qty 5

## 2018-09-01 MED ORDER — LACTATED RINGERS IV SOLN
INTRAVENOUS | Status: DC | PRN
  Administered 2018-09-01: 07:00:00 via INTRAVENOUS

## 2018-09-01 MED ORDER — BISACODYL 5 MG PO TBEC
10.0000 mg | DELAYED_RELEASE_TABLET | Freq: Every day | ORAL | Status: DC
  Administered 2018-09-02 – 2018-09-03 (×2): 10 mg via ORAL
  Filled 2018-09-01 (×2): qty 2

## 2018-09-01 MED ORDER — ORAL CARE MOUTH RINSE
15.0000 mL | Freq: Two times a day (BID) | OROMUCOSAL | Status: DC
  Administered 2018-09-02: 15 mL via OROMUCOSAL

## 2018-09-01 MED ORDER — ROCURONIUM BROMIDE 50 MG/5ML IV SOSY
PREFILLED_SYRINGE | INTRAVENOUS | Status: AC
  Filled 2018-09-01: qty 10

## 2018-09-01 MED ORDER — PANTOPRAZOLE SODIUM 40 MG PO TBEC
40.0000 mg | DELAYED_RELEASE_TABLET | Freq: Every day | ORAL | Status: DC
  Administered 2018-09-03 – 2018-09-04 (×2): 40 mg via ORAL
  Filled 2018-09-01 (×3): qty 1

## 2018-09-01 MED ORDER — INSULIN REGULAR BOLUS VIA INFUSION
0.0000 [IU] | Freq: Three times a day (TID) | INTRAVENOUS | Status: DC
  Filled 2018-09-01: qty 10

## 2018-09-01 MED ORDER — POTASSIUM CHLORIDE 10 MEQ/50ML IV SOLN
10.0000 meq | INTRAVENOUS | Status: AC
  Administered 2018-09-01 (×3): 10 meq via INTRAVENOUS

## 2018-09-01 MED ORDER — SODIUM CHLORIDE 0.9 % IV SOLN: 250.0000 mL | INTRAVENOUS | Status: DC

## 2018-09-01 MED ORDER — DEXMEDETOMIDINE HCL IN NACL 200 MCG/50ML IV SOLN: 0.0000 ug/kg/h | INTRAVENOUS | Status: DC

## 2018-09-01 MED ORDER — 0.9 % SODIUM CHLORIDE (POUR BTL) OPTIME
TOPICAL | Status: DC | PRN
  Administered 2018-09-01: 6000 mL

## 2018-09-01 MED ORDER — PHENYLEPHRINE HCL-NACL 20-0.9 MG/250ML-% IV SOLN: 0.0000 ug/min | INTRAVENOUS | Status: DC

## 2018-09-01 MED ORDER — LACTATED RINGERS IV SOLN
INTRAVENOUS | Status: DC | PRN
  Administered 2018-09-01 (×2): via INTRAVENOUS

## 2018-09-01 MED ORDER — SODIUM CHLORIDE 0.9 % IV SOLN
INTRAVENOUS | Status: DC
  Administered 2018-09-03: 08:00:00 via INTRAVENOUS

## 2018-09-01 MED ORDER — SODIUM CHLORIDE 0.45 % IV SOLN
INTRAVENOUS | Status: DC | PRN
  Administered 2018-09-01: 12:00:00 via INTRAVENOUS

## 2018-09-01 MED ORDER — ROCURONIUM BROMIDE 100 MG/10ML IV SOLN
INTRAVENOUS | Status: DC | PRN
  Administered 2018-09-01: 80 mg via INTRAVENOUS

## 2018-09-01 MED ORDER — SODIUM CHLORIDE 0.9 % IV SOLN
INTRAVENOUS | Status: DC | PRN
  Administered 2018-09-01: 12:00:00 via INTRAVENOUS

## 2018-09-01 MED ORDER — MIDAZOLAM HCL (PF) 10 MG/2ML IJ SOLN
INTRAMUSCULAR | Status: AC
  Filled 2018-09-01: qty 2

## 2018-09-01 MED ORDER — PROTAMINE SULFATE 10 MG/ML IV SOLN
INTRAVENOUS | Status: DC | PRN
  Administered 2018-09-01: 40 mg via INTRAVENOUS
  Administered 2018-09-01 (×2): 30 mg via INTRAVENOUS
  Administered 2018-09-01: 60 mg via INTRAVENOUS
  Administered 2018-09-01: 40 mg via INTRAVENOUS

## 2018-09-01 MED ORDER — ACETAMINOPHEN 650 MG RE SUPP
650.0000 mg | Freq: Once | RECTAL | Status: AC
  Administered 2018-09-01: 650 mg via RECTAL
  Filled 2018-09-01: qty 1

## 2018-09-01 MED ORDER — DOCUSATE SODIUM 100 MG PO CAPS
200.0000 mg | ORAL_CAPSULE | Freq: Every day | ORAL | Status: DC
  Administered 2018-09-02 – 2018-09-03 (×2): 200 mg via ORAL
  Filled 2018-09-01 (×4): qty 2

## 2018-09-01 MED ORDER — MAGNESIUM SULFATE 4 GM/100ML IV SOLN
4.0000 g | Freq: Once | INTRAVENOUS | Status: AC
  Administered 2018-09-01: 4 g via INTRAVENOUS
  Filled 2018-09-01: qty 100

## 2018-09-01 MED ORDER — SODIUM CHLORIDE 0.9% FLUSH
10.0000 mL | Freq: Two times a day (BID) | INTRAVENOUS | Status: DC
  Administered 2018-09-01 – 2018-09-03 (×5): 10 mL

## 2018-09-01 MED ORDER — LIDOCAINE 2% (20 MG/ML) 5 ML SYRINGE
INTRAMUSCULAR | Status: AC
  Filled 2018-09-01: qty 5

## 2018-09-01 MED ORDER — ACETAMINOPHEN 160 MG/5ML PO SOLN: 650.0000 mg | Freq: Once | ORAL | Status: AC

## 2018-09-01 MED ORDER — HEPARIN SODIUM (PORCINE) 1000 UNIT/ML IJ SOLN
INTRAMUSCULAR | Status: AC
  Filled 2018-09-01: qty 1

## 2018-09-01 MED ORDER — SODIUM CHLORIDE 0.9 % IV SOLN
INTRAVENOUS | Status: DC | PRN
  Administered 2018-09-01: 25 ug/min via INTRAVENOUS

## 2018-09-01 MED ORDER — ASPIRIN 81 MG PO CHEW: 324.0000 mg | CHEWABLE_TABLET | Freq: Every day | ORAL | Status: DC

## 2018-09-01 MED ORDER — THROMBIN (RECOMBINANT) 20000 UNITS EX SOLR
CUTANEOUS | Status: AC
  Filled 2018-09-01: qty 20000

## 2018-09-01 MED ORDER — HEMOSTATIC AGENTS (NO CHARGE) OPTIME
TOPICAL | Status: DC | PRN
  Administered 2018-09-01 (×2): 1 via TOPICAL

## 2018-09-01 MED ORDER — ORAL CARE MOUTH RINSE
15.0000 mL | OROMUCOSAL | Status: DC
  Administered 2018-09-01: 15 mL via OROMUCOSAL

## 2018-09-01 MED ORDER — CHLORHEXIDINE GLUCONATE 0.12 % MT SOLN
15.0000 mL | OROMUCOSAL | Status: AC
  Administered 2018-09-01: 15 mL via OROMUCOSAL

## 2018-09-01 MED ORDER — ONDANSETRON HCL 4 MG/2ML IJ SOLN
4.0000 mg | Freq: Four times a day (QID) | INTRAMUSCULAR | Status: DC | PRN
  Administered 2018-09-02: 4 mg via INTRAVENOUS
  Filled 2018-09-01: qty 2

## 2018-09-01 MED ORDER — FENTANYL CITRATE (PF) 250 MCG/5ML IJ SOLN
INTRAMUSCULAR | Status: DC | PRN
  Administered 2018-09-01 (×2): 250 ug via INTRAVENOUS
  Administered 2018-09-01: 150 ug via INTRAVENOUS
  Administered 2018-09-01: 100 ug via INTRAVENOUS
  Administered 2018-09-01: 50 ug via INTRAVENOUS
  Administered 2018-09-01: 150 ug via INTRAVENOUS
  Administered 2018-09-01: 50 ug via INTRAVENOUS
  Administered 2018-09-01: 100 ug via INTRAVENOUS
  Administered 2018-09-01: 150 ug via INTRAVENOUS

## 2018-09-01 MED ORDER — LACTATED RINGERS IV SOLN: INTRAVENOUS | Status: DC

## 2018-09-01 MED ORDER — SODIUM BICARBONATE 8.4 % IV SOLN
50.0000 meq | Freq: Once | INTRAVENOUS | Status: AC
  Administered 2018-09-01: 50 meq via INTRAVENOUS
  Filled 2018-09-01: qty 50

## 2018-09-01 MED ORDER — INSULIN ASPART 100 UNIT/ML ~~LOC~~ SOLN
0.0000 [IU] | SUBCUTANEOUS | Status: DC
  Administered 2018-09-01 – 2018-09-03 (×6): 2 [IU] via SUBCUTANEOUS

## 2018-09-01 MED ORDER — FAMOTIDINE 20 MG IN NS 100 ML IVPB
20.0000 mg | Freq: Two times a day (BID) | INTRAVENOUS | Status: DC
  Administered 2018-09-01: 20 mg via INTRAVENOUS
  Filled 2018-09-01 (×2): qty 100

## 2018-09-01 MED ORDER — CHLORHEXIDINE GLUCONATE 0.12% ORAL RINSE (MEDLINE KIT)
15.0000 mL | Freq: Two times a day (BID) | OROMUCOSAL | Status: DC
  Administered 2018-09-01: 15 mL via OROMUCOSAL

## 2018-09-01 MED ORDER — EPHEDRINE SULFATE 50 MG/ML IJ SOLN
INTRAMUSCULAR | Status: DC | PRN
  Administered 2018-09-01: 10 mg via INTRAVENOUS
  Administered 2018-09-01: 5 mg via INTRAVENOUS

## 2018-09-01 MED ORDER — LACTATED RINGERS IV SOLN
INTRAVENOUS | Status: DC
  Administered 2018-09-02: 16:00:00 via INTRAVENOUS

## 2018-09-01 MED ORDER — ASPIRIN EC 325 MG PO TBEC
325.0000 mg | DELAYED_RELEASE_TABLET | Freq: Every day | ORAL | Status: DC
  Administered 2018-09-02 – 2018-09-06 (×5): 325 mg via ORAL
  Filled 2018-09-01 (×5): qty 1

## 2018-09-01 MED ORDER — ACETAMINOPHEN 500 MG PO TABS
1000.0000 mg | ORAL_TABLET | Freq: Four times a day (QID) | ORAL | Status: DC
  Administered 2018-09-01 – 2018-09-06 (×7): 1000 mg via ORAL
  Filled 2018-09-01 (×9): qty 2

## 2018-09-01 MED ORDER — MIDAZOLAM HCL 2 MG/2ML IJ SOLN: 2.0000 mg | INTRAMUSCULAR | Status: DC | PRN

## 2018-09-01 MED ORDER — PROTAMINE SULFATE 10 MG/ML IV SOLN
INTRAVENOUS | Status: AC
  Filled 2018-09-01: qty 25

## 2018-09-01 MED ORDER — SODIUM CHLORIDE 0.9 % IV SOLN
1.5000 g | Freq: Two times a day (BID) | INTRAVENOUS | Status: AC
  Administered 2018-09-01 – 2018-09-03 (×4): 1.5 g via INTRAVENOUS
  Filled 2018-09-01 (×4): qty 1.5

## 2018-09-01 MED ORDER — ALBUMIN HUMAN 5 % IV SOLN: 250.0000 mL | INTRAVENOUS | Status: AC | PRN

## 2018-09-01 MED ORDER — VECURONIUM BROMIDE 10 MG IV SOLR
INTRAVENOUS | Status: DC | PRN
  Administered 2018-09-01 (×3): 5 mg via INTRAVENOUS

## 2018-09-01 MED ORDER — MORPHINE SULFATE (PF) 2 MG/ML IV SOLN
1.0000 mg | INTRAVENOUS | Status: DC | PRN
  Administered 2018-09-01: 2 mg via INTRAVENOUS
  Filled 2018-09-01: qty 1

## 2018-09-01 MED ORDER — MIDAZOLAM HCL 5 MG/5ML IJ SOLN
INTRAMUSCULAR | Status: DC | PRN
  Administered 2018-09-01 (×2): 2 mg via INTRAVENOUS
  Administered 2018-09-01: 1 mg via INTRAVENOUS
  Administered 2018-09-01: 2 mg via INTRAVENOUS
  Administered 2018-09-01: 1 mg via INTRAVENOUS
  Administered 2018-09-01: 2 mg via INTRAVENOUS

## 2018-09-01 SURGICAL SUPPLY — 102 items
BAG DECANTER FOR FLEXI CONT (MISCELLANEOUS) ×3 IMPLANT
BANDAGE ACE 4X5 VEL STRL LF (GAUZE/BANDAGES/DRESSINGS) ×3 IMPLANT
BANDAGE ACE 6X5 VEL STRL LF (GAUZE/BANDAGES/DRESSINGS) ×3 IMPLANT
BANDAGE ELASTIC 4 VELCRO ST LF (GAUZE/BANDAGES/DRESSINGS) ×1 IMPLANT
BANDAGE ELASTIC 6 VELCRO ST LF (GAUZE/BANDAGES/DRESSINGS) ×1 IMPLANT
BASKET HEART (ORDER IN 25'S) (MISCELLANEOUS) ×1
BASKET HEART (ORDER IN 25S) (MISCELLANEOUS) ×2 IMPLANT
BLADE STERNUM SYSTEM 6 (BLADE) ×3 IMPLANT
BNDG GAUZE ELAST 4 BULKY (GAUZE/BANDAGES/DRESSINGS) ×3 IMPLANT
CANISTER SUCT 3000ML PPV (MISCELLANEOUS) ×3 IMPLANT
CATH ROBINSON RED A/P 18FR (CATHETERS) ×6 IMPLANT
CATH THORACIC 28FR (CATHETERS) ×3 IMPLANT
CATH THORACIC 36FR (CATHETERS) ×3 IMPLANT
CATH THORACIC 36FR RT ANG (CATHETERS) ×3 IMPLANT
CLIP VESOCCLUDE MED 24/CT (CLIP) IMPLANT
CLIP VESOCCLUDE SM WIDE 24/CT (CLIP) ×1 IMPLANT
COVER WAND RF STERILE (DRAPES) ×3 IMPLANT
CRADLE DONUT ADULT HEAD (MISCELLANEOUS) ×3 IMPLANT
DRAPE CARDIOVASCULAR INCISE (DRAPES) ×3
DRAPE SLUSH/WARMER DISC (DRAPES) ×3 IMPLANT
DRAPE SRG 135X102X78XABS (DRAPES) ×2 IMPLANT
DRSG COVADERM 4X14 (GAUZE/BANDAGES/DRESSINGS) ×3 IMPLANT
ELECT CAUTERY BLADE 6.4 (BLADE) ×3 IMPLANT
ELECT REM PT RETURN 9FT ADLT (ELECTROSURGICAL) ×6
ELECTRODE REM PT RTRN 9FT ADLT (ELECTROSURGICAL) ×4 IMPLANT
FELT TEFLON 1X6 (MISCELLANEOUS) ×6 IMPLANT
GAUZE SPONGE 4X4 12PLY STRL (GAUZE/BANDAGES/DRESSINGS) ×6 IMPLANT
GLOVE BIO SURGEON STRL SZ 6 (GLOVE) IMPLANT
GLOVE BIO SURGEON STRL SZ 6.5 (GLOVE) ×6 IMPLANT
GLOVE BIO SURGEON STRL SZ7 (GLOVE) IMPLANT
GLOVE BIO SURGEON STRL SZ7.5 (GLOVE) ×2 IMPLANT
GLOVE BIOGEL PI IND STRL 6 (GLOVE) IMPLANT
GLOVE BIOGEL PI IND STRL 6.5 (GLOVE) IMPLANT
GLOVE BIOGEL PI IND STRL 7.0 (GLOVE) IMPLANT
GLOVE BIOGEL PI INDICATOR 6 (GLOVE)
GLOVE BIOGEL PI INDICATOR 6.5 (GLOVE)
GLOVE BIOGEL PI INDICATOR 7.0 (GLOVE)
GLOVE EUDERMIC 7 POWDERFREE (GLOVE) ×6 IMPLANT
GLOVE ORTHO TXT STRL SZ7.5 (GLOVE) IMPLANT
GOWN STRL REUS W/ TWL LRG LVL3 (GOWN DISPOSABLE) ×8 IMPLANT
GOWN STRL REUS W/ TWL XL LVL3 (GOWN DISPOSABLE) ×2 IMPLANT
GOWN STRL REUS W/TWL LRG LVL3 (GOWN DISPOSABLE) ×24
GOWN STRL REUS W/TWL XL LVL3 (GOWN DISPOSABLE) ×3
HEMOSTAT POWDER SURGIFOAM 1G (HEMOSTASIS) ×9 IMPLANT
HEMOSTAT SURGICEL 2X14 (HEMOSTASIS) ×4 IMPLANT
INSERT FOGARTY 61MM (MISCELLANEOUS) IMPLANT
INSERT FOGARTY XLG (MISCELLANEOUS) IMPLANT
KIT BASIN OR (CUSTOM PROCEDURE TRAY) ×3 IMPLANT
KIT CATH CPB BARTLE (MISCELLANEOUS) ×3 IMPLANT
KIT SUCTION CATH 14FR (SUCTIONS) ×3 IMPLANT
KIT TURNOVER KIT B (KITS) ×3 IMPLANT
KIT VASOVIEW HEMOPRO 2 VH 4000 (KITS) ×3 IMPLANT
NS IRRIG 1000ML POUR BTL (IV SOLUTION) ×16 IMPLANT
PACK E OPEN HEART (SUTURE) ×3 IMPLANT
PACK OPEN HEART (CUSTOM PROCEDURE TRAY) ×3 IMPLANT
PAD ARMBOARD 7.5X6 YLW CONV (MISCELLANEOUS) ×6 IMPLANT
PAD ELECT DEFIB RADIOL ZOLL (MISCELLANEOUS) ×3 IMPLANT
PENCIL BUTTON HOLSTER BLD 10FT (ELECTRODE) ×3 IMPLANT
PUNCH AORTIC ROTATE 4.0MM (MISCELLANEOUS) IMPLANT
PUNCH AORTIC ROTATE 4.5MM 8IN (MISCELLANEOUS) ×3 IMPLANT
PUNCH AORTIC ROTATE 5MM 8IN (MISCELLANEOUS) IMPLANT
SET CARDIOPLEGIA MPS 5001102 (MISCELLANEOUS) ×1 IMPLANT
SPONGE INTESTINAL PEANUT (DISPOSABLE) IMPLANT
SPONGE LAP 18X18 RF (DISPOSABLE) IMPLANT
SPONGE LAP 18X18 X RAY DECT (DISPOSABLE) ×1 IMPLANT
SPONGE LAP 4X18 RFD (DISPOSABLE) ×3 IMPLANT
SUT BONE WAX W31G (SUTURE) ×3 IMPLANT
SUT MNCRL AB 4-0 PS2 18 (SUTURE) IMPLANT
SUT PROLENE 3 0 SH DA (SUTURE) IMPLANT
SUT PROLENE 3 0 SH1 36 (SUTURE) ×3 IMPLANT
SUT PROLENE 4 0 RB 1 (SUTURE)
SUT PROLENE 4 0 SH DA (SUTURE) IMPLANT
SUT PROLENE 4-0 RB1 .5 CRCL 36 (SUTURE) IMPLANT
SUT PROLENE 5 0 C 1 36 (SUTURE) IMPLANT
SUT PROLENE 6 0 C 1 30 (SUTURE) IMPLANT
SUT PROLENE 7 0 BV 1 (SUTURE) IMPLANT
SUT PROLENE 7 0 BV1 MDA (SUTURE) ×3 IMPLANT
SUT PROLENE 8 0 BV175 6 (SUTURE) IMPLANT
SUT SILK  1 MH (SUTURE)
SUT SILK 1 MH (SUTURE) IMPLANT
SUT SILK 2 0 SH (SUTURE) IMPLANT
SUT STEEL STERNAL CCS#1 18IN (SUTURE) IMPLANT
SUT STEEL SZ 6 DBL 3X14 BALL (SUTURE) IMPLANT
SUT VIC AB 1 CTX 36 (SUTURE) ×15
SUT VIC AB 1 CTX36XBRD ANBCTR (SUTURE) ×4 IMPLANT
SUT VIC AB 2-0 CT1 27 (SUTURE) ×3
SUT VIC AB 2-0 CT1 TAPERPNT 27 (SUTURE) IMPLANT
SUT VIC AB 2-0 CTX 27 (SUTURE) IMPLANT
SUT VIC AB 3-0 SH 27 (SUTURE)
SUT VIC AB 3-0 SH 27X BRD (SUTURE) IMPLANT
SUT VIC AB 3-0 X1 27 (SUTURE) ×1 IMPLANT
SUT VICRYL 4-0 PS2 18IN ABS (SUTURE) IMPLANT
SYR BULB IRRIGATION 50ML (SYRINGE) ×1 IMPLANT
SYSTEM SAHARA CHEST DRAIN ATS (WOUND CARE) ×3 IMPLANT
TAPE CLOTH SURG 4X10 WHT LF (GAUZE/BANDAGES/DRESSINGS) ×1 IMPLANT
TAPE PAPER 2X10 WHT MICROPORE (GAUZE/BANDAGES/DRESSINGS) ×1 IMPLANT
TOWEL GREEN STERILE (TOWEL DISPOSABLE) ×3 IMPLANT
TOWEL GREEN STERILE FF (TOWEL DISPOSABLE) ×3 IMPLANT
TRAY FOLEY SLVR 16FR TEMP STAT (SET/KITS/TRAYS/PACK) ×3 IMPLANT
TUBING LAP HI FLOW INSUFFLATIO (TUBING) ×3 IMPLANT
UNDERPAD 30X30 (UNDERPADS AND DIAPERS) ×3 IMPLANT
WATER STERILE IRR 1000ML POUR (IV SOLUTION) ×6 IMPLANT

## 2018-09-01 NOTE — Anesthesia Postprocedure Evaluation (Signed)
Anesthesia Post Note  Patient: Larry Roberts  Procedure(s) Performed: CORONARY ARTERY BYPASS GRAFTING (CABG)  x two, using left internal mammary and right saphenous vein (endoscopic vein harvest). (N/A Chest) TRANSESOPHAGEAL ECHOCARDIOGRAM (TEE) (N/A Esophagus)     Patient location during evaluation: SICU Anesthesia Type: General Level of consciousness: sedated Pain management: pain level controlled Vital Signs Assessment: post-procedure vital signs reviewed and stable Respiratory status: patient remains intubated per anesthesia plan Cardiovascular status: stable Postop Assessment: no apparent nausea or vomiting Anesthetic complications: no    Last Vitals:  Vitals:   09/01/18 1400 09/01/18 1500  BP: 98/64 (!) 81/53  Pulse: 80 80  Resp: 12 12  Temp: (!) 35.1 C (!) 35.6 C  SpO2: 100% 100%    Last Pain:  Vitals:   09/01/18 1230  TempSrc: Core  PainSc:                  Ilea Hilton

## 2018-09-01 NOTE — Transfer of Care (Signed)
Immediate Anesthesia Transfer of Care Note  Patient: Larry Roberts  Procedure(s) Performed: CORONARY ARTERY BYPASS GRAFTING (CABG)  x two, using left internal mammary and right saphenous vein (endoscopic vein harvest). (N/A Chest) TRANSESOPHAGEAL ECHOCARDIOGRAM (TEE) (N/A Esophagus)  Patient Location: SICU  Anesthesia Type:General  Level of Consciousness: Patient remains intubated per anesthesia plan  Airway & Oxygen Therapy: Patient remains intubated per anesthesia plan and Patient placed on Ventilator (see vital sign flow sheet for setting)  Post-op Assessment: Report given to RN and Post -op Vital signs reviewed and stable  Post vital signs: Reviewed and stable  Last Vitals:  Vitals Value Taken Time  BP 93/58 09/01/2018 12:19 PM  Temp    Pulse 80 09/01/2018 12:21 PM  Resp 12 09/01/2018 12:21 PM  SpO2 96 % 09/01/2018 12:21 PM  Vitals shown include unvalidated device data.  Last Pain:  Vitals:   09/01/18 0523  TempSrc: Oral  PainSc:       Patients Stated Pain Goal: 0 (40/81/44 8185)  Complications: No apparent anesthesia complications

## 2018-09-01 NOTE — Progress Notes (Signed)
  Echocardiogram Echocardiogram Transesophageal has been performed.  Larry Roberts 09/01/2018, 10:14 AM

## 2018-09-01 NOTE — Anesthesia Procedure Notes (Signed)
Procedure Name: Intubation Date/Time: 09/01/2018 7:55 AM Performed by: Neldon Newport, CRNA Pre-anesthesia Checklist: Timeout performed, Patient being monitored, Suction available, Emergency Drugs available and Patient identified Patient Re-evaluated:Patient Re-evaluated prior to induction Oxygen Delivery Method: Circle system utilized Preoxygenation: Pre-oxygenation with 100% oxygen Induction Type: IV induction Ventilation: Mask ventilation without difficulty Laryngoscope Size: Mac and 4 Grade View: Grade II Tube type: Oral Tube size: 8.0 mm Number of attempts: 1 Placement Confirmation: breath sounds checked- equal and bilateral,  positive ETCO2 and ETT inserted through vocal cords under direct vision Secured at: 23 cm Tube secured with: Tape Dental Injury: Teeth and Oropharynx as per pre-operative assessment

## 2018-09-01 NOTE — Anesthesia Procedure Notes (Signed)
Arterial Line Insertion Start/End3/20/2020 6:45 AM, 09/01/2018 6:57 AM Performed by: Neldon Newport, CRNA, CRNA  Patient location: Pre-op. Preanesthetic checklist: patient identified, IV checked, site marked, risks and benefits discussed, surgical consent, pre-op evaluation and timeout performed Patient sedated Left, radial was placed Catheter size: 20 G Hand hygiene performed  and maximum sterile barriers used   Attempts: 2 Procedure performed without using ultrasound guided technique. Following insertion, dressing applied and Biopatch. Post procedure assessment: normal and unchanged  Patient tolerated the procedure well with no immediate complications.

## 2018-09-01 NOTE — Progress Notes (Signed)
CT Surgery  CABGx2 Extubated Neuro intact Min chest tube output Stable hemodynamics

## 2018-09-01 NOTE — Op Note (Signed)
CARDIOVASCULAR SURGERY OPERATIVE NOTE  09/01/2018  Surgeon:  Gaye Pollack, MD  Roberts Assistant: Jadene Pierini,  PA-C   Preoperative Diagnosis:  Severe multi-vessel coronary artery disease   Postoperative Diagnosis:  Same   Procedure:  1. Median Sternotomy 2. Extracorporeal circulation 3.   Coronary artery bypass grafting x 2   Left internal mammary graft to the LAD  SVG to diagonal 2   4.   Endoscopic vein harvest from the right leg   Anesthesia:  General Endotracheal   Clinical History/Surgical Indication:  Larry Roberts is a 78 year old gentleman who has hypertension and hyperlipidemia as well as a remote smoking history who has been very active.  Larry Roberts has developed some burning pain across his upper chest with radiation to his throat and right anterior shoulder over the past several months.  This been associated with activity and relieved with rest.  Larry Roberts says that Larry Roberts saw his PCP initially and it was felt to be reflux but did not improve.  Larry Roberts had a nuclear stress test that showed ischemia in the anteroseptal wall and apex.  Cardiac catheterization today showed a 99% proximal LAD stenosis at the takeoff of 2 small to moderate size diagonal branches.  There is about 50% stenosis beyond this in the LAD and about 75% distal stenosis.  The left circumflex had about 50 to 60% stenosis in a large marginal branch.  The right coronary artery had about 50 to 60% mid vessel stenosis.  Left ventricular systolic function was normal with normal left ventricular end-diastolic pressure.  I agree that coronary bypass graft surgery is the best treatment for this patient.  I discussed the operative procedure with the patient and family including alternatives, benefits and risks; including but not limited to bleeding, blood transfusion, infection, stroke, myocardial infarction, graft failure, heart block requiring a  permanent pacemaker, organ dysfunction, and death.  Larry Roberts understands and agrees to proceed.   Preparation:  The patient was seen in the preoperative holding area and the correct patient, correct operation were confirmed with the patient after reviewing the medical record and catheterization. The consent was signed by me. Preoperative antibiotics were given. A pulmonary arterial line and radial arterial line were placed by the anesthesia team. The patient was taken back to the operating room and positioned supine on the operating room table. After being placed under general endotracheal anesthesia by the anesthesia team a foley catheter was placed. The neck, chest, abdomen, and both legs were prepped with betadine soap and solution and draped in the usual sterile manner. A surgical time-out was taken and the correct patient and operative procedure were confirmed with the nursing and anesthesia staff.   Cardiopulmonary Bypass:  A median sternotomy was performed. The pericardium was opened in the midline. Right ventricular function appeared normal. The ascending aorta was of normal size and had no palpable plaque. There were no contraindications to aortic cannulation or cross-clamping. The patient was fully systemically heparinized and the ACT was maintained > 400 sec. The proximal aortic arch was cannulated with a 20 F aortic cannula for arterial inflow. Venous cannulation was performed via the right atrial appendage using a two-staged venous cannula. An antegrade cardioplegia/vent cannula was inserted into the mid-ascending aorta. Aortic occlusion was performed with a single cross-clamp. Systemic cooling to 32 degrees Centigrade and topical cooling of the heart with iced saline were used. Hyperkalemic antegrade cold blood cardioplegia was used to induce diastolic arrest and was then given at about 20 minute intervals throughout  the period of arrest to maintain myocardial temperature at or below  10 degrees centigrade. A temperature probe was inserted into the interventricular septum and an insulating pad was placed in the pericardium.   Left internal mammary harvest:  The left side of the sternum was retracted using the Rultract retractor. The left internal mammary artery was harvested as a pedicle graft. All side branches were clipped. It was a medium-sized vessel of good quality with excellent blood flow. It was ligated distally and divided. It was sprayed with topical papaverine solution to prevent vasospasm.   Endoscopic vein harvest:  The right greater saphenous vein was harvested endoscopically through a 2 cm incision medial to the right knee. It was harvested from the thigh. It was a medium-sized vein of good quality. The side branches were all ligated with 4-0 silk ties.    Coronary arteries:  The coronary arteries were examined.   LAD:  Intramyocardial but located in the midportion where it was a large vessel with no disease. D1 was intramyocardial. It was located in its midportion in the muscle and was small as noted on cath and diffusely diseased. I did not feel that this was a suitable vessel for grafting due to its small size, diffuse disease and deep intramyocardial location. The D2 was a larger branch that bifurcated and both sub-branches communicated on cath. There was proximal disease but no distal disease.  LCX:  Moderate non-obstructive disease on cath.   RCA:  Moderate non-obstructive disease on cath.   Grafts:  1. LIMA to the LAD: 2.5 mm. It was sewn end to side using 8-0 prolene continuous suture. 2. SVG to D2:  1.6 mm. It was sewn end to side using 7-0 prolene continuous suture.   The proximal vein graft anastomosis was performed to the mid-ascending aorta using continuous 6-0 prolene suture. A graft marker was placed around the proximal anastomosis.   Completion:  The patient was rewarmed to 37 degrees Centigrade. The clamp was removed from the  LIMA pedicle and there was rapid warming of the septum and return of ventricular fibrillation. The crossclamp was removed with a time of 46 minutes. There was spontaneous return of sinus rhythm. The distal and proximal anastomoses were checked for hemostasis. The position of the grafts was satisfactory. Two temporary epicardial pacing wires were placed on the right atrium and two on the right ventricle. The patient was weaned from CPB without difficulty on no inotropes. CPB time was 63 minutes. Cardiac output was 5 LPM. TEE showed normal LV systolic function. Heparin was fully reversed with protamine and the aortic and venous cannulas removed. Hemostasis was achieved. Mediastinal and left pleural drainage tubes were placed. The sternum was closed with double #6 stainless steel wires. The fascia was closed with continuous # 1 vicryl suture. The subcutaneous tissue was closed with 2-0 vicryl continuous suture. The skin was closed with 3-0 vicryl subcuticular suture. All sponge, needle, and instrument counts were reported correct at the end of the case. Dry sterile dressings were placed over the incisions and around the chest tubes which were connected to pleurevac suction. The patient was then transported to the surgical intensive care unit in stable condition.

## 2018-09-01 NOTE — Procedures (Signed)
Extubation Procedure Note  Patient Details:   Name: JESSON FOSKEY DOB: 08-11-1940 MRN: [id number]   Airway Documentation:  Airway 8 mm (Active)  Secured at (cm) 23 cm 09/01/2018  4:00 PM  Measured From Lips 09/01/2018  4:00 PM  Secured Location Right 09/01/2018  4:00 PM  Secured By Rana Snare Tape 09/01/2018  4:00 PM  Site Condition Dry 09/01/2018  4:00 PM   Vent end date: (not recorded) Vent end time: (not recorded)   Evaluation  O2 sats: stable throughout Complications: No apparent complications Patient did tolerate procedure well. Bilateral Breath Sounds: Clear   Yes   Patient performed a NIF of -22 and a VC of 1.1L with good effort. Patient had a cuff leak prior to extubation and no stridor was noted. A 4L [place] was placed on the patient and he is tolerating well at this time. RN and daughter were at the bedside with RT during extubation.  Tiburcio Bash 09/01/2018, 6:15 PM

## 2018-09-01 NOTE — Brief Op Note (Signed)
08/30/2018 - 09/01/2018  8:16 AM  PATIENT:  Radene Journey  78 y.o. male  PRE-OPERATIVE DIAGNOSIS:  CAD  POST-OPERATIVE DIAGNOSIS:  CAD  PROCEDURE:  Procedure(s): CORONARY ARTERY BYPASS GRAFTING (CABG)  x two, using left internal mammary and right saphenous vein (endoscopic vein harvest). (N/A) TRANSESOPHAGEAL ECHOCARDIOGRAM (TEE) (N/A) LIMA-LAD SVG-DIAG  SURGEON:  Surgeon(s) and Role:    * Bartle, Fernande Boyden, MD - Primary  PHYSICIAN ASSISTANT:   PA-C  ANESTHESIA:   general  EBL:  1100 mL   BLOOD ADMINISTERED:none  DRAINS: ROUTINE PLEURAL AND PERICARDIAL CHEST TUBES   LOCAL MEDICATIONS USED:  NONE  SPECIMEN:  No Specimen  DISPOSITION OF SPECIMEN:  N/A  COUNTS:  YES  TOURNIQUET:  * No tourniquets in log *  DICTATION: .Dragon Dictation  PLAN OF CARE: Admit to inpatient   PATIENT DISPOSITION:  ICU - intubated and hemodynamically stable.   Delay start of Pharmacological VTE agent (>24hrs) due to surgical blood loss or risk of bleeding: yes  COMPLICATIONS: NO KNOWN

## 2018-09-01 NOTE — Anesthesia Preprocedure Evaluation (Addendum)
Anesthesia Evaluation  Patient identified by MRN, date of birth, ID band Patient awake    Reviewed: Allergy & Precautions, NPO status , Patient's Chart, lab work & pertinent test results  History of Anesthesia Complications Negative for: history of anesthetic complications  Airway Mallampati: II  TM Distance: >3 FB Neck ROM: full    Dental  (+) Teeth Intact, Dental Advidsory Given   Pulmonary former smoker,    breath sounds clear to auscultation       Cardiovascular hypertension, + angina with exertion + CAD   Rhythm:regular Rate:Bradycardia     Neuro/Psych  Neuromuscular disease    GI/Hepatic Neg liver ROS, GERD  Medicated and Controlled,  Endo/Other  negative endocrine ROS  Renal/GU negative Renal ROS     Musculoskeletal  (+) Arthritis ,   Abdominal   Peds  Hematology negative hematology ROS (+)   Anesthesia Other Findings No chest pain or SOB within last 12 Hours.  Reproductive/Obstetrics                            Anesthesia Physical Anesthesia Plan  ASA: IV  Anesthesia Plan: General   Post-op Pain Management:    Induction: Intravenous  PONV Risk Score and Plan: 2 and Treatment may vary due to age or medical condition  Airway Management Planned: Oral ETT  Additional Equipment: Arterial line, CVP, PA Cath, TEE and Ultrasound Guidance Line Placement  Intra-op Plan:   Post-operative Plan: Post-operative intubation/ventilation  Informed Consent: I have reviewed the patients History and Physical, chart, labs and discussed the procedure including the risks, benefits and alternatives for the proposed anesthesia with the patient or authorized representative who has indicated his/her understanding and acceptance.     Dental Advisory Given and Dental advisory given  Plan Discussed with: CRNA and Surgeon  Anesthesia Plan Comments:        Anesthesia Quick Evaluation

## 2018-09-01 NOTE — Anesthesia Procedure Notes (Signed)
Central Venous Catheter Insertion Performed by: Roderic Palau, MD, anesthesiologist Start/End3/20/2020 7:10 AM, 09/01/2018 7:25 AM Patient location: Pre-op. Preanesthetic checklist: patient identified, IV checked, site marked, risks and benefits discussed, surgical consent, monitors and equipment checked, pre-op evaluation, timeout performed and anesthesia consent Hand hygiene performed  and maximum sterile barriers used  PA cath was placed.Swan type:thermodilution PA Cath depth:50 Procedure performed without using ultrasound guided technique. Attempts: 1 Patient tolerated the procedure well with no immediate complications.

## 2018-09-01 NOTE — Anesthesia Procedure Notes (Signed)
Central Venous Catheter Insertion Performed by: Roderic Palau, MD, anesthesiologist Start/End3/20/2020 7:10 AM, 09/01/2018 7:25 AM Patient location: Pre-op. Preanesthetic checklist: patient identified, IV checked, site marked, risks and benefits discussed, surgical consent, monitors and equipment checked, pre-op evaluation, timeout performed and anesthesia consent Position: Trendelenburg Lidocaine 1% used for infiltration and patient sedated Hand hygiene performed , maximum sterile barriers used  and Seldinger technique used Catheter size: 9 Fr Total catheter length 10. Central line was placed.MAC introducer Procedure performed using ultrasound guided technique. Ultrasound Notes:anatomy identified, needle tip was noted to be adjacent to the nerve/plexus identified, no ultrasound evidence of intravascular and/or intraneural injection and image(s) printed for medical record Attempts: 1 Following insertion, line sutured, dressing applied and Biopatch. Post procedure assessment: blood return through all ports, free fluid flow and no air  Patient tolerated the procedure well with no immediate complications.

## 2018-09-02 ENCOUNTER — Inpatient Hospital Stay (HOSPITAL_COMMUNITY): Payer: Medicare Other

## 2018-09-02 DIAGNOSIS — I200 Unstable angina: Secondary | ICD-10-CM

## 2018-09-02 DIAGNOSIS — Z951 Presence of aortocoronary bypass graft: Secondary | ICD-10-CM

## 2018-09-02 LAB — POCT I-STAT 7, (LYTES, BLD GAS, ICA,H+H)
Acid-base deficit: 4 mmol/L — ABNORMAL HIGH (ref 0.0–2.0)
Acid-base deficit: 5 mmol/L — ABNORMAL HIGH (ref 0.0–2.0)
Acid-base deficit: 6 mmol/L — ABNORMAL HIGH (ref 0.0–2.0)
Acid-base deficit: 3 mmol/L — ABNORMAL HIGH (ref 0.0–2.0)
Bicarbonate: 21.3 mmol/L (ref 20.0–28.0)
Bicarbonate: 20.5 mmol/L (ref 20.0–28.0)
Bicarbonate: 19.5 mmol/L — ABNORMAL LOW (ref 20.0–28.0)
Bicarbonate: 22.6 mmol/L (ref 20.0–28.0)
CALCIUM ION: 1.26 mmol/L (ref 1.15–1.40)
Calcium, Ion: 1.23 mmol/L (ref 1.15–1.40)
Calcium, Ion: 1.26 mmol/L (ref 1.15–1.40)
Calcium, Ion: 1.26 mmol/L (ref 1.15–1.40)
HCT: 31 % — ABNORMAL LOW (ref 39.0–52.0)
HCT: 34 % — ABNORMAL LOW (ref 39.0–52.0)
HCT: 35 % — ABNORMAL LOW (ref 39.0–52.0)
HEMATOCRIT: 34 % — AB (ref 39.0–52.0)
Hemoglobin: 10.5 g/dL — ABNORMAL LOW (ref 13.0–17.0)
Hemoglobin: 11.6 g/dL — ABNORMAL LOW (ref 13.0–17.0)
Hemoglobin: 11.6 g/dL — ABNORMAL LOW (ref 13.0–17.0)
Hemoglobin: 11.9 g/dL — ABNORMAL LOW (ref 13.0–17.0)
O2 Saturation: 98 %
O2 Saturation: 97 %
O2 Saturation: 97 %
O2 Saturation: 93 %
Patient temperature: 35.2
Patient temperature: 37.7
Patient temperature: 37.7
Potassium: 3.4 mmol/L — ABNORMAL LOW (ref 3.5–5.1)
Potassium: 4.2 mmol/L (ref 3.5–5.1)
Potassium: 4.2 mmol/L (ref 3.5–5.1)
Potassium: 4.1 mmol/L (ref 3.5–5.1)
Sodium: 144 mmol/L (ref 135–145)
Sodium: 141 mmol/L (ref 135–145)
Sodium: 140 mmol/L (ref 135–145)
Sodium: 141 mmol/L (ref 135–145)
TCO2: 23 mmol/L (ref 22–32)
TCO2: 22 mmol/L (ref 22–32)
TCO2: 21 mmol/L — ABNORMAL LOW (ref 22–32)
TCO2: 24 mmol/L (ref 22–32)
pCO2 arterial: 37.6 mmHg (ref 32.0–48.0)
pCO2 arterial: 40 mmHg (ref 32.0–48.0)
pCO2 arterial: 40.5 mmHg (ref 32.0–48.0)
pCO2 arterial: 42.6 mmHg (ref 32.0–48.0)
pH, Arterial: 7.354 (ref 7.350–7.450)
pH, Arterial: 7.318 — ABNORMAL LOW (ref 7.350–7.450)
pH, Arterial: 7.293 — ABNORMAL LOW (ref 7.350–7.450)
pH, Arterial: 7.335 — ABNORMAL LOW (ref 7.350–7.450)
pO2, Arterial: 101 mmHg (ref 83.0–108.0)
pO2, Arterial: 101 mmHg (ref 83.0–108.0)
pO2, Arterial: 100 mmHg (ref 83.0–108.0)
pO2, Arterial: 74 mmHg — ABNORMAL LOW (ref 83.0–108.0)

## 2018-09-02 LAB — BASIC METABOLIC PANEL
ANION GAP: 9 (ref 5–15)
Anion gap: 7 (ref 5–15)
BUN: 9 mg/dL (ref 8–23)
BUN: 12 mg/dL (ref 8–23)
CO2: 22 mmol/L (ref 22–32)
CO2: 24 mmol/L (ref 22–32)
Calcium: 8.6 mg/dL — ABNORMAL LOW (ref 8.9–10.3)
Calcium: 8.7 mg/dL — ABNORMAL LOW (ref 8.9–10.3)
Chloride: 109 mmol/L (ref 98–111)
Chloride: 102 mmol/L (ref 98–111)
Creatinine, Ser: 0.97 mg/dL (ref 0.61–1.24)
Creatinine, Ser: 1.27 mg/dL — ABNORMAL HIGH (ref 0.61–1.24)
GFR calc Af Amer: >60 mL/min (ref >60)
GFR calc Af Amer: >60 mL/min (ref >60)
GFR calc non Af Amer: >60 mL/min (ref >60)
GFR calc non Af Amer: 55 mL/min — ABNORMAL LOW (ref >60)
Glucose, Bld: 130 mg/dL — ABNORMAL HIGH (ref 70–99)
Glucose, Bld: 125 mg/dL — ABNORMAL HIGH (ref 70–99)
POTASSIUM: 4 mmol/L (ref 3.5–5.1)
Potassium: 3.6 mmol/L (ref 3.5–5.1)
SODIUM: 135 mmol/L (ref 135–145)
Sodium: 138 mmol/L (ref 135–145)

## 2018-09-02 LAB — CBC
HCT: 35.1 % — ABNORMAL LOW (ref 39.0–52.0)
HCT: 33.4 % — ABNORMAL LOW (ref 39.0–52.0)
Hemoglobin: 12.1 g/dL — ABNORMAL LOW (ref 13.0–17.0)
Hemoglobin: 11.4 g/dL — ABNORMAL LOW (ref 13.0–17.0)
MCH: 30.8 pg (ref 26.0–34.0)
MCH: 30.6 pg (ref 26.0–34.0)
MCHC: 34.5 g/dL (ref 30.0–36.0)
MCHC: 34.1 g/dL (ref 30.0–36.0)
MCV: 89.3 fL (ref 80.0–100.0)
MCV: 89.5 fL (ref 80.0–100.0)
NRBC: 0 % (ref 0.0–0.2)
Platelets: 135 10*3/uL — ABNORMAL LOW (ref 150–400)
Platelets: 133 10*3/uL — ABNORMAL LOW (ref 150–400)
RBC: 3.93 MIL/uL — ABNORMAL LOW (ref 4.22–5.81)
RBC: 3.73 MIL/uL — ABNORMAL LOW (ref 4.22–5.81)
RDW: 12.4 % (ref 11.5–15.5)
RDW: 12.4 % (ref 11.5–15.5)
WBC: 11.2 10*3/uL — ABNORMAL HIGH (ref 4.0–10.5)
WBC: 11.2 10*3/uL — ABNORMAL HIGH (ref 4.0–10.5)
nRBC: 0 % (ref 0.0–0.2)

## 2018-09-02 LAB — MAGNESIUM
Magnesium: 2.1 mg/dL (ref 1.7–2.4)
Magnesium: 2.1 mg/dL (ref 1.7–2.4)

## 2018-09-02 LAB — GLUCOSE, CAPILLARY
Glucose-Capillary: 125 mg/dL — ABNORMAL HIGH (ref 70–99)
Glucose-Capillary: 123 mg/dL — ABNORMAL HIGH (ref 70–99)
Glucose-Capillary: 122 mg/dL — ABNORMAL HIGH (ref 70–99)
Glucose-Capillary: 128 mg/dL — ABNORMAL HIGH (ref 70–99)
Glucose-Capillary: 114 mg/dL — ABNORMAL HIGH (ref 70–99)

## 2018-09-02 MED ORDER — ALUM & MAG HYDROXIDE-SIMETH 200-200-20 MG/5ML PO SUSP
15.0000 mL | Freq: Three times a day (TID) | ORAL | Status: DC | PRN
  Administered 2018-09-02: 15 mL via ORAL
  Filled 2018-09-02: qty 30

## 2018-09-02 MED ORDER — SIMETHICONE 80 MG PO CHEW
80.0000 mg | CHEWABLE_TABLET | Freq: Four times a day (QID) | ORAL | Status: DC
  Administered 2018-09-02 – 2018-09-04 (×6): 80 mg via ORAL
  Filled 2018-09-02 (×8): qty 1

## 2018-09-02 MED ORDER — POTASSIUM CHLORIDE 10 MEQ/50ML IV SOLN
10.0000 meq | INTRAVENOUS | Status: AC
  Administered 2018-09-02 – 2018-09-03 (×3): 10 meq via INTRAVENOUS
  Filled 2018-09-02 (×3): qty 50

## 2018-09-02 MED ORDER — ALPRAZOLAM 0.5 MG PO TABS
1.0000 mg | ORAL_TABLET | Freq: Every day | ORAL | Status: DC
  Administered 2018-09-02: 1 mg via ORAL
  Filled 2018-09-02: qty 2

## 2018-09-02 MED ORDER — FUROSEMIDE 10 MG/ML IJ SOLN
20.0000 mg | Freq: Two times a day (BID) | INTRAMUSCULAR | Status: DC
  Administered 2018-09-02 – 2018-09-03 (×3): 20 mg via INTRAVENOUS
  Filled 2018-09-02 (×3): qty 2

## 2018-09-02 NOTE — Progress Notes (Addendum)
Progress Note  Patient Name: Larry Roberts Date of Encounter: 09/02/2018  Primary Cardiologist: Kirk Ruths, MD   Subjective   Doing well, sitting up in chair.  Inpatient Medications    Scheduled Meds: . acetaminophen  1,000 mg Oral Q6H   Or  . acetaminophen (TYLENOL) oral liquid 160 mg/5 mL  1,000 mg Per Tube Q6H  . ALPRAZolam  1 mg Oral QHS  . aspirin EC  325 mg Oral Daily   Or  . aspirin  324 mg Per Tube Daily  . bisacodyl  10 mg Oral Daily   Or  . bisacodyl  10 mg Rectal Daily  . Chlorhexidine Gluconate Cloth  6 each Topical Daily  . docusate sodium  200 mg Oral Daily  . dorzolamide-timolol  1 drop Both Eyes BID  . furosemide  20 mg Intravenous BID  . insulin aspart  0-24 Units Subcutaneous Q4H  . latanoprost  1 drop Both Eyes QHS  . mouth rinse  15 mL Mouth Rinse BID  . metoprolol tartrate  12.5 mg Oral BID   Or  . metoprolol tartrate  12.5 mg Per Tube BID  . [START ON 09/03/2018] pantoprazole  40 mg Oral Daily  . rosuvastatin  40 mg Oral QHS  . sodium chloride flush  10-40 mL Intracatheter Q12H  . tamsulosin  0.4 mg Oral Q72H   Continuous Infusions: . sodium chloride Stopped (09/02/18 1104)  . sodium chloride    . sodium chloride 10 mL/hr at 09/01/18 1215  . albumin human    . cefUROXime (ZINACEF)  IV Stopped (09/02/18 0849)  . lactated ringers    . nitroGLYCERIN Stopped (09/01/18 1447)  . phenylephrine (NEO-SYNEPHRINE) Adult infusion 10 mcg/min (09/01/18 1215)   PRN Meds: sodium chloride, albumin human, metoprolol tartrate, midazolam, morphine injection, ondansetron (ZOFRAN) IV, oxyCODONE, sodium chloride flush, traMADol   Vital Signs    Vitals:   09/02/18 0900 09/02/18 1000 09/02/18 1100 09/02/18 1200  BP: 127/63 123/76 121/68 (!) 102/49  Pulse: 81 80 78 73  Resp: 17 (!) 23 19 (!) 24  Temp: 99.7 F (37.6 C) 99.7 F (37.6 C) 99.7 F (37.6 C)   TempSrc:      SpO2: 92% 94% 92% 97%  Weight:      Height:        Intake/Output Summary  (Last 24 hours) at 09/02/2018 1227 Last data filed at 09/02/2018 1221 Gross per 24 hour  Intake 1926.36 ml  Output 3280 ml  Net -1353.64 ml   Last 3 Weights 09/02/2018 09/01/2018 08/31/2018  Weight (lbs) 201 lb 4.5 oz 190 lb 14.7 oz 190 lb 9.6 oz  Weight (kg) 91.3 kg 86.6 kg 86.456 kg      Telemetry    Sinus rhythm no arrhythmias- Personally Reviewed  ECG    Sinus rhythm J-point elevation V2 V3- Personally Reviewed  Physical Exam   GEN: No acute distress.   Cardiac:  Telemetry with sinus rhythm Respiratory:  Normal respiratory effort. GI:  non-distended  MS:  No deformity. Neuro:  Nonfocal  Psych: Normal affect   Labs    Chemistry Recent Labs  Lab 09/01/18 0417  09/01/18 1113  09/01/18 1845 09/01/18 1911 09/01/18 2258 09/02/18 0633  NA 140   < > 142   < > 139 140 141 138  K 4.0   < > 4.2   < > 4.1 4.2 4.1 4.0  CL 111  --   --   --  111  --   --  109  CO2 22  --   --   --  21*  --   --  22  GLUCOSE 107*   < > 103*  --  140*  --   --  130*  BUN 11  --   --   --  9  --   --  9  CREATININE 1.01  --   --   --  0.98  --   --  0.97  CALCIUM 9.8  --   --   --  8.6*  --   --  8.6*  GFRNONAA >60  --   --   --  >60  --   --  >60  GFRAA >60  --   --   --  >60  --   --  >60  ANIONGAP 7  --   --   --  7  --   --  7   < > = values in this interval not displayed.     Hematology Recent Labs  Lab 09/01/18 1230  09/01/18 1845 09/01/18 1911 09/01/18 2258 09/02/18 0633  WBC 5.3  --  9.6  --   --  11.2*  RBC 3.49*  --  4.16*  --   --  3.93*  HGB 11.0*   < > 12.8* 11.6* 11.9* 12.1*  HCT 31.3*   < > 37.4* 34.0* 35.0* 35.1*  MCV 89.7  --  89.9  --   --  89.3  MCH 31.5  --  30.8  --   --  30.8  MCHC 35.1  --  34.2  --   --  34.5  RDW 12.4  --  12.3  --   --  12.4  PLT 104*  --  138*  --   --  135*   < > = values in this interval not displayed.    Cardiac EnzymesNo results for input(s): TROPONINI in the last 168 hours. No results for input(s): TROPIPOC in the last 168  hours.   BNPNo results for input(s): BNP, PROBNP in the last 168 hours.   DDimer No results for input(s): DDIMER in the last 168 hours.   Radiology    Dg Chest Port 1 View  Result Date: 09/02/2018 CLINICAL DATA:  Follow-up. EXAM: PORTABLE CHEST 1 VIEW COMPARISON:  09/01/2018 FINDINGS: Right IJ Swan-Ganz catheter, mediastinal drain and left sided chest tubes unchanged. Interval removal of endotracheal tube and nasogastric tube. Lungs are adequately inflated with subtle linear atelectasis right base. No effusion or pneumothorax. Borderline cardiomegaly. Remainder of the exam is unchanged. IMPRESSION: Minimal linear atelectasis right base. Tubes and lines as described. Electronically Signed   By: Marin Olp M.D.   On: 09/02/2018 08:32   Dg Chest Port 1 View  Result Date: 09/01/2018 CLINICAL DATA:  Postop CABG. EXAM: PORTABLE CHEST 1 VIEW COMPARISON:  08/17/2018 and 04/29/2014. FINDINGS: Interval CABG. Endotracheal tube, right IJ Swan-Ganz catheter, enteric tube, mediastinal drain and left chest tubes are satisfactorily positioned. There is mild atelectasis at both lung bases. No edema, pneumothorax or significant pleural effusion. The heart size and mediastinal contours are stable. IMPRESSION: Interval CABG without demonstrated complication. Mild bibasilar atelectasis. Electronically Signed   By: Richardean Sale M.D.   On: 09/01/2018 12:44   Vas US Doppler Pre Cabg  Result Date: 08/31/2018 PREOPERATIVE VASCULAR EVALUATION  Indications:  Pre-surgical evaluation. Risk Factors: Hypertension, coronary artery disease. Performing Technologist: Maudry Mayhew MHA, RVT, RDCS, RDMS  Examination Guidelines: A complete evaluation includes  B-mode imaging, spectral Doppler, color Doppler, and power Doppler as needed of all accessible portions of each vessel. Bilateral testing is considered an integral part of a complete examination. Limited examinations for reoccurring indications may be performed as  noted.  Right Carotid Findings: +----------+--------+--------+--------+------------------------------+--------+           PSV cm/sEDV cm/sStenosisDescribe                      Comments +----------+--------+--------+--------+------------------------------+--------+ CCA Prox  104     13                                                     +----------+--------+--------+--------+------------------------------+--------+ CCA Distal80      16              smooth, heterogenous and focal         +----------+--------+--------+--------+------------------------------+--------+ ICA Prox  29      10              smooth, heterogenous and focal         +----------+--------+--------+--------+------------------------------+--------+ ICA Distal66      19                                                     +----------+--------+--------+--------+------------------------------+--------+ ECA       94      9               focal, heterogenous and smooth         +----------+--------+--------+--------+------------------------------+--------+ Portions of this table do not appear on this page. +----------+--------+-------+----------------+------------+           PSV cm/sEDV cmsDescribe        Arm Pressure +----------+--------+-------+----------------+------------+ Subclavian214            Multiphasic, OIZ124          +----------+--------+-------+----------------+------------+ +---------+--------+--+--------+-+---------+ VertebralPSV cm/s21EDV cm/s7Antegrade +---------+--------+--+--------+-+---------+ Left Carotid Findings: +----------+--------+-------+--------+--------------------------------+--------+           PSV cm/sEDV    StenosisDescribe                        Comments                   cm/s                                                    +----------+--------+-------+--------+--------------------------------+--------+ CCA Prox  122     24                                                       +----------+--------+-------+--------+--------------------------------+--------+ CCA Distal121     23             smooth, heterogenous and  calcific                                 +----------+--------+-------+--------+--------------------------------+--------+ ICA Prox  59      17                                                      +----------+--------+-------+--------+--------------------------------+--------+ ICA Distal92      27                                                      +----------+--------+-------+--------+--------------------------------+--------+ ECA       107     15                                                      +----------+--------+-------+--------+--------------------------------+--------+ +----------+--------+--------+----------------+------------+ SubclavianPSV cm/sEDV cm/sDescribe        Arm Pressure +----------+--------+--------+----------------+------------+           226             Multiphasic, WNL             +----------+--------+--------+----------------+------------+ +---------+--------+--+--------+--+---------+ VertebralPSV cm/s48EDV cm/s15Antegrade +---------+--------+--+--------+--+---------+  ABI Findings: +--------+------------------+-----+---------+--------+ Right   Rt Pressure (mmHg)IndexWaveform Comment  +--------+------------------+-----+---------+--------+ [id number]                    triphasic         +--------+------------------+-----+---------+--------+ PTA                            triphasic         +--------+------------------+-----+---------+--------+ DP                             triphasic         +--------+------------------+-----+---------+--------+ +--------+------------------+-----+---------+-------+ Left    Lt Pressure (mmHg)IndexWaveform Comment  +--------+------------------+-----+---------+-------+ Brachial                       triphasic        +--------+------------------+-----+---------+-------+ PTA                            triphasic        +--------+------------------+-----+---------+-------+ DP                             triphasic        +--------+------------------+-----+---------+-------+  Right Doppler Findings: +-----------+--------+-----+---------+-----------------------------------------+ Site       PressureIndexDoppler  Comments                                  +-----------+--------+-----+---------+-----------------------------------------+ Brachial   138          triphasic                                          +-----------+--------+-----+---------+-----------------------------------------+  Radial                  triphasic                                          +-----------+--------+-----+---------+-----------------------------------------+ Ulnar                   triphasic                                          +-----------+--------+-----+---------+-----------------------------------------+ Palmar Arch                      Signal is unaffected with radial                                           compression, decreases <50% with ulnar                                     compression.                              +-----------+--------+-----+---------+-----------------------------------------+  Left Doppler Findings: +-----------+--------+-----+---------+-----------------------------------------+ Site       PressureIndexDoppler  Comments                                  +-----------+--------+-----+---------+-----------------------------------------+ Brachial                triphasic                                          +-----------+--------+-----+---------+-----------------------------------------+ Radial                  triphasic                                           +-----------+--------+-----+---------+-----------------------------------------+ Ulnar                   triphasic                                          +-----------+--------+-----+---------+-----------------------------------------+ Palmar Arch                      Signal is unaffected with radial                                           compression, obliterates wtih ulnar  compression.                              +-----------+--------+-----+---------+-----------------------------------------+  Summary: Right Carotid: Velocities in the right ICA are consistent with a 1-39% stenosis. Left Carotid: Velocities in the left ICA are consistent with a 1-39% stenosis. Vertebrals:  Bilateral vertebral arteries demonstrate antegrade flow. Subclavians: Normal flow hemodynamics were seen in bilateral subclavian              arteries.  Electronically signed by Servando Snare MD on 08/31/2018 at 4:50:51 PM.    Final     Cardiac Studies    Echocardiogram 08/31/2018:  1. The left ventricle has normal systolic function with an ejection fraction of 60-65%. The cavity size was normal. There is mildly increased left ventricular wall thickness. Left ventricular diastolic Doppler parameters are consistent with impaired  relaxation. Indeterminate filling pressures The E/e' is 8-15. No evidence of left ventricular regional wall motion abnormalities.  2. The right ventricle has normal systolic function. The cavity was normal. There is no increase in right ventricular wall thickness.  3. The mitral valve is degenerative. Mild thickening of the mitral valve leaflet.  4. The aortic valve is tricuspid.  Patient Profile     78 y.o. male was severe multivessel coronary artery disease status post CABG x2 LIMA to LAD SVG to diagonal 2.  Assessment & Plan    Severe coronary artery disease -Status post CABG.  Progressing well.  Continue with aggressive  secondary risk factor prevention, statin, aspirin, beta-blocker  Essential hypertension -Currently well controlled on medications as above.  Mixed hyperlipidemia -Crestor 40 mg a day.  Excellent.       For questions or updates, please contact [place] Please consult [email or web address] for contact info under        Signed, Candee Furbish, MD  09/02/2018, 12:27 PM

## 2018-09-02 NOTE — Progress Notes (Signed)
  CT surgery p.m. Rounds  Patient walked 2 laps around the unit Chest tubes out maintaining sinus rhythm Breathing comfortably on 2 L P.m. labs are satisfactory Mild complaint of heartburn and nausea

## 2018-09-02 NOTE — Plan of Care (Signed)
  Problem: Education: Goal: Knowledge of General Education information will improve Description Including pain rating scale, medication(s)/side effects and non-pharmacologic comfort measures Outcome: Progressing   Problem: Education: Goal: Will demonstrate proper wound care and an understanding of methods to prevent future damage Outcome: Progressing Goal: Knowledge of disease or condition will improve Outcome: Progressing Goal: Knowledge of the prescribed therapeutic regimen will improve Outcome: Progressing   Problem: Activity: Goal: Risk for activity intolerance will decrease Outcome: Progressing   Problem: Cardiac: Goal: Will achieve and/or maintain hemodynamic stability Outcome: Progressing   Problem: Clinical Measurements: Goal: Postoperative complications will be avoided or minimized Outcome: Progressing   Problem: Respiratory: Goal: Respiratory status will improve Outcome: Progressing   Problem: Skin Integrity: Goal: Wound healing without signs and symptoms of infection Outcome: Progressing Goal: Risk for impaired skin integrity will decrease Outcome: Progressing   Problem: Urinary Elimination: Goal: Ability to achieve and maintain adequate renal perfusion and functioning will improve Outcome: Progressing   Problem: Health Behavior/Discharge Planning: Goal: Ability to manage health-related needs will improve Outcome: Progressing   Problem: Clinical Measurements: Goal: Ability to maintain clinical measurements within normal limits will improve Outcome: Progressing Goal: Will remain free from infection Outcome: Progressing Goal: Diagnostic test results will improve Outcome: Progressing Goal: Respiratory complications will improve Outcome: Progressing Goal: Cardiovascular complication will be avoided Outcome: Progressing   Problem: Activity: Goal: Risk for activity intolerance will decrease Outcome: Progressing   Problem: Nutrition: Goal: Adequate  nutrition will be maintained Outcome: Progressing   Problem: Coping: Goal: Level of anxiety will decrease Outcome: Progressing   Problem: Elimination: Goal: Will not experience complications related to bowel motility Outcome: Progressing Goal: Will not experience complications related to urinary retention Outcome: Progressing   Problem: Pain Managment: Goal: General experience of comfort will improve Outcome: Progressing   Problem: Safety: Goal: Ability to remain free from injury will improve Outcome: Progressing   Problem: Skin Integrity: Goal: Risk for impaired skin integrity will decrease Outcome: Progressing

## 2018-09-02 NOTE — Progress Notes (Signed)
1 Day Post-Op Procedure(s) (LRB): CORONARY ARTERY BYPASS GRAFTING (CABG)  x two, using left internal mammary and right saphenous vein (endoscopic vein harvest). (N/A) TRANSESOPHAGEAL ECHOCARDIOGRAM (TEE) (N/A) Subjective: Doing well nsr  Objective: Vital signs in last 24 hours: Temp:  [95.2 F (35.1 C)-99.9 F (37.7 C)] 99.9 F (37.7 C) (03/21 0700) Pulse Rate:  [79-88] 85 (03/21 0700) Cardiac Rhythm: Atrial paced (03/21 0000) Resp:  [10-28] 19 (03/21 0700) BP: (81-134)/(51-80) 114/59 (03/21 0600) SpO2:  [92 %-100 %] 92 % (03/21 0700) Arterial Line BP: (96-168)/(41-76) 153/76 (03/21 0700) FiO2 (%):  [40 %-50 %] 40 % (03/20 1733) Weight:  [91.3 kg] 91.3 kg (03/21 0645)  Hemodynamic parameters for last 24 hours: PAP: (21-37)/(6-20) 33/14 CO:  [3.1 L/min-5.2 L/min] 5.2 L/min CI:  [1.5 L/min/m2-2.5 L/min/m2] 2.5 L/min/m2  Intake/Output from previous day: 03/20 0701 - 03/21 0700 In: 5740.3 [P.O.:180; I.V.:3684.5; Blood:470; IV Piggyback:1405.8] Out: 5697 [Urine:3400; Blood:1100; Chest Tube:610] Intake/Output this shift: Total I/O In: -  Out: 65 [Urine:25; Chest Tube:40]       Exam    General- alert and comfortable    Neck- no JVD, no cervical adenopathy palpable, no carotid bruit   Lungs- clear without rales, wheezes   Cor- regular rate and rhythm, no murmur , gallop   Abdomen- soft, non-tender   Extremities - warm, non-tender, minimal edema   Neuro- oriented, appropriate, no focal weakness   Lab Results: Recent Labs    09/01/18 1845  09/01/18 2258 09/02/18 0633  WBC 9.6  --   --  11.2*  HGB 12.8*   < > 11.9* 12.1*  HCT 37.4*   < > 35.0* 35.1*  PLT 138*  --   --  135*   < > = values in this interval not displayed.   BMET:  Recent Labs    09/01/18 1845  09/01/18 2258 09/02/18 0633  NA 139   < > 141 138  K 4.1   < > 4.1 4.0  CL 111  --   --  109  CO2 21*  --   --  22  GLUCOSE 140*  --   --  130*  BUN 9  --   --  9  CREATININE 0.98  --   --  0.97   CALCIUM 8.6*  --   --  8.6*   < > = values in this interval not displayed.    PT/INR:  Recent Labs    09/01/18 1230  LABPROT 17.5*  INR 1.5*   ABG    Component Value Date/Time   PHART 7.335 (L) 09/01/2018 2258   HCO3 22.6 09/01/2018 2258   TCO2 24 09/01/2018 2258   ACIDBASEDEF 3.0 (H) 09/01/2018 2258   O2SAT 93.0 09/01/2018 2258   CBG (last 3)  Recent Labs    09/02/18 0113 09/02/18 0517 09/02/18 0632  GLUCAP 125* 123* 122*    Assessment/Plan: S/P Procedure(s) (LRB): CORONARY ARTERY BYPASS GRAFTING (CABG)  x two, using left internal mammary and right saphenous vein (endoscopic vein harvest). (N/A) TRANSESOPHAGEAL ECHOCARDIOGRAM (TEE) (N/A) Mobilize Diuresis d/c tubes/lines See progression orders   LOS: 3 days    Tharon Aquas Trigt III 09/02/2018

## 2018-09-03 LAB — BASIC METABOLIC PANEL
Anion gap: 9 (ref 5–15)
BUN: 11 mg/dL (ref 8–23)
CO2: 24 mmol/L (ref 22–32)
Calcium: 8.5 mg/dL — ABNORMAL LOW (ref 8.9–10.3)
Chloride: 101 mmol/L (ref 98–111)
Creatinine, Ser: 1.08 mg/dL (ref 0.61–1.24)
GFR calc Af Amer: >60 mL/min (ref >60)
GFR calc non Af Amer: >60 mL/min (ref >60)
Glucose, Bld: 109 mg/dL — ABNORMAL HIGH (ref 70–99)
Potassium: 3.8 mmol/L (ref 3.5–5.1)
Sodium: 134 mmol/L — ABNORMAL LOW (ref 135–145)

## 2018-09-03 LAB — CBC
HCT: 32.5 % — ABNORMAL LOW (ref 39.0–52.0)
Hemoglobin: 10.9 g/dL — ABNORMAL LOW (ref 13.0–17.0)
MCH: 29.9 pg (ref 26.0–34.0)
MCHC: 33.5 g/dL (ref 30.0–36.0)
MCV: 89.3 fL (ref 80.0–100.0)
Platelets: 118 10*3/uL — ABNORMAL LOW (ref 150–400)
RBC: 3.64 MIL/uL — ABNORMAL LOW (ref 4.22–5.81)
RDW: 12.2 % (ref 11.5–15.5)
WBC: 11.1 10*3/uL — ABNORMAL HIGH (ref 4.0–10.5)
nRBC: 0 % (ref 0.0–0.2)

## 2018-09-03 LAB — GLUCOSE, CAPILLARY
Glucose-Capillary: 100 mg/dL — ABNORMAL HIGH (ref 70–99)
Glucose-Capillary: 110 mg/dL — ABNORMAL HIGH (ref 70–99)
Glucose-Capillary: 128 mg/dL — ABNORMAL HIGH (ref 70–99)

## 2018-09-03 MED ORDER — SODIUM CHLORIDE 0.9% FLUSH: 3.0000 mL | INTRAVENOUS | Status: DC | PRN

## 2018-09-03 MED ORDER — FUROSEMIDE 10 MG/ML IJ SOLN
40.0000 mg | Freq: Every day | INTRAMUSCULAR | Status: DC
  Administered 2018-09-04: 40 mg via INTRAVENOUS
  Filled 2018-09-03: qty 4

## 2018-09-03 MED ORDER — ENOXAPARIN SODIUM 40 MG/0.4ML ~~LOC~~ SOLN
40.0000 mg | SUBCUTANEOUS | Status: DC
  Administered 2018-09-03 – 2018-09-05 (×3): 40 mg via SUBCUTANEOUS
  Filled 2018-09-03 (×4): qty 0.4

## 2018-09-03 MED ORDER — SODIUM CHLORIDE 0.9% FLUSH
3.0000 mL | Freq: Two times a day (BID) | INTRAVENOUS | Status: DC
  Administered 2018-09-03 – 2018-09-05 (×6): 3 mL via INTRAVENOUS

## 2018-09-03 MED ORDER — MAGNESIUM HYDROXIDE 400 MG/5ML PO SUSP: 30.0000 mL | Freq: Every day | ORAL | Status: DC | PRN

## 2018-09-03 MED ORDER — ALPRAZOLAM 0.25 MG PO TABS
0.2500 mg | ORAL_TABLET | Freq: Every day | ORAL | Status: DC
  Administered 2018-09-03 – 2018-09-05 (×3): 0.25 mg via ORAL
  Filled 2018-09-03 (×3): qty 1

## 2018-09-03 MED ORDER — MOVING RIGHT ALONG BOOK
Freq: Once | Status: AC
  Administered 2018-09-03: 11:00:00

## 2018-09-03 MED ORDER — SODIUM CHLORIDE 0.9 % IV SOLN: 250.0000 mL | INTRAVENOUS | Status: DC | PRN

## 2018-09-03 NOTE — Progress Notes (Signed)
Received patient from [place], [place] at 1500 today, assessed patient at 1600, and obtained vital signs.

## 2018-09-03 NOTE — Progress Notes (Signed)
Progress Note  Patient Name: Larry Roberts Date of Encounter: 09/03/2018  Primary Cardiologist: Kirk Ruths, MD   Subjective   Overall having his ups and downs post CABG, no significant chest pain, no significant shortness of breath, up in chair.  Ambulated yesterday.  Inpatient Medications    Scheduled Meds: . acetaminophen  1,000 mg Oral Q6H   Or  . acetaminophen (TYLENOL) oral liquid 160 mg/5 mL  1,000 mg Per Tube Q6H  . ALPRAZolam  1 mg Oral QHS  . aspirin EC  325 mg Oral Daily   Or  . aspirin  324 mg Per Tube Daily  . bisacodyl  10 mg Oral Daily   Or  . bisacodyl  10 mg Rectal Daily  . Chlorhexidine Gluconate Cloth  6 each Topical Daily  . docusate sodium  200 mg Oral Daily  . dorzolamide-timolol  1 drop Both Eyes BID  . furosemide  20 mg Intravenous BID  . insulin aspart  0-24 Units Subcutaneous Q4H  . latanoprost  1 drop Both Eyes QHS  . metoprolol tartrate  12.5 mg Oral BID   Or  . metoprolol tartrate  12.5 mg Per Tube BID  . pantoprazole  40 mg Oral Daily  . rosuvastatin  40 mg Oral QHS  . simethicone  80 mg Oral QID  . sodium chloride flush  10-40 mL Intracatheter Q12H  . tamsulosin  0.4 mg Oral Q72H   Continuous Infusions: . sodium chloride Stopped (09/02/18 1104)  . sodium chloride    . sodium chloride 10 mL/hr at 09/03/18 1751  . lactated ringers 50 mL/hr at 09/03/18 0500  . nitroGLYCERIN Stopped (09/01/18 1447)  . phenylephrine (NEO-SYNEPHRINE) Adult infusion 10 mcg/min (09/01/18 1215)   PRN Meds: sodium chloride, alum & mag hydroxide-simeth, metoprolol tartrate, midazolam, morphine injection, ondansetron (ZOFRAN) IV, oxyCODONE, sodium chloride flush, traMADol   Vital Signs    Vitals:   09/03/18 0500 09/03/18 0530 09/03/18 0700 09/03/18 0804  BP: 119/65  134/69   Pulse: 79 80 88   Resp: 16 20 (!) 21   Temp:    98.3 F (36.8 C)  TempSrc:    Oral  SpO2: 91% 90% 97%   Weight: 92.1 kg     Height:        Intake/Output Summary (Last  24 hours) at 09/03/2018 0857 Last data filed at 09/03/2018 0500 Gross per 24 hour  Intake 1027.26 ml  Output 475 ml  Net 552.26 ml   Last 3 Weights 09/03/2018 09/02/2018 09/01/2018  Weight (lbs) 203 lb 0.7 oz 201 lb 4.5 oz 190 lb 14.7 oz  Weight (kg) 92.1 kg 91.3 kg 86.6 kg      Telemetry    Normal sinus rhythm with no arrhythmias personally Reviewed  ECG    Sinus rhythm J-point elevation in V2 as well as V3- Personally Reviewed  Physical Exam   GEN: No acute distress.   Cardiac:  Cardiac telemetry reviewed sinus rhythm Respiratory:  Normal respiratory effort noted. GI:  non-distended  MS: No deformity. Neuro:  Nonfocal  Psych: Normal affect   Labs    Chemistry Recent Labs  Lab 09/02/18 0258 09/02/18 1731 09/03/18 0607  NA 138 135 134*  K 4.0 3.6 3.8  CL 109 102 101  CO2 22 24 24   GLUCOSE 130* 125* 109*  BUN 9 12 11   CREATININE 0.97 1.27* 1.08  CALCIUM 8.6* 8.7* 8.5*  GFRNONAA >60 55* >60  GFRAA >60 >60 >60  ANIONGAP 7 9 9  Hematology Recent Labs  Lab 09/02/18 [phone] 09/02/18 1731 09/03/18 0607  WBC 11.2* 11.2* 11.1*  RBC 3.93* 3.73* 3.64*  HGB 12.1* 11.4* 10.9*  HCT 35.1* 33.4* 32.5*  MCV 89.3 89.5 89.3  MCH 30.8 30.6 29.9  MCHC 34.5 34.1 33.5  RDW 12.4 12.4 12.2  PLT 135* 133* 118*    Cardiac EnzymesNo results for input(s): TROPONINI in the last 168 hours. No results for input(s): TROPIPOC in the last 168 hours.   BNPNo results for input(s): BNP, PROBNP in the last 168 hours.   DDimer No results for input(s): DDIMER in the last 168 hours.   Radiology    Dg Chest Port 1 View  Result Date: 09/02/2018 CLINICAL DATA:  Follow-up. EXAM: PORTABLE CHEST 1 VIEW COMPARISON:  09/01/2018 FINDINGS: Right IJ Swan-Ganz catheter, mediastinal drain and left sided chest tubes unchanged. Interval removal of endotracheal tube and nasogastric tube. Lungs are adequately inflated with subtle linear atelectasis right base. No effusion or pneumothorax. Borderline  cardiomegaly. Remainder of the exam is unchanged. IMPRESSION: Minimal linear atelectasis right base. Tubes and lines as described. Electronically Signed   By: Marin Olp M.D.   On: 09/02/2018 08:32   Dg Chest Port 1 View  Result Date: 09/01/2018 CLINICAL DATA:  Postop CABG. EXAM: PORTABLE CHEST 1 VIEW COMPARISON:  08/17/2018 and 04/29/2014. FINDINGS: Interval CABG. Endotracheal tube, right IJ Swan-Ganz catheter, enteric tube, mediastinal drain and left chest tubes are satisfactorily positioned. There is mild atelectasis at both lung bases. No edema, pneumothorax or significant pleural effusion. The heart size and mediastinal contours are stable. IMPRESSION: Interval CABG without demonstrated complication. Mild bibasilar atelectasis. Electronically Signed   By: Richardean Sale M.D.   On: 09/01/2018 12:44    Cardiac Studies   Echocardiogram -EF 65%  Patient Profile     78 y.o. male multivessel coronary artery disease status post CABG x2 LIMA to LAD SVG to diagonal  Assessment & Plan    Coronary artery disease -CABG secondary risk factor prevention continue with aspirin 325 beta-blocker, statin high intensity.  Ambulation.    Essential hypertension -Currently well controlled.  No changes made in medications.  Mixed hyperlipidemia -On high intensity statin Crestor 40 mg a day continue to monitor as outpatient.      For questions or updates, please contact [place] Please consult [email or web address] for contact info under        Signed, Candee Furbish, MD  09/03/2018, 8:57 AM

## 2018-09-03 NOTE — Progress Notes (Signed)
Pt received from [facility]. VSS. CHG complete. Pt and family oriented to room and unit. Will continue to monitor.  Clyde Canterbury, RN

## 2018-09-03 NOTE — Progress Notes (Signed)
2 Days Post-Op Procedure(s) (LRB): CORONARY ARTERY BYPASS GRAFTING (CABG)  x two, using left internal mammary and right saphenous vein (endoscopic vein harvest). (N/A) TRANSESOPHAGEAL ECHOCARDIOGRAM (TEE) (N/A) Subjective: Progressing well Ambulated 4 laps tx to stepdown  Objective: Vital signs in last 24 hours: Temp:  [98.3 F (36.8 C)-99.7 F (37.6 C)] 98.3 F (36.8 C) (03/22 0804) Pulse Rate:  [67-88] 81 (03/22 0800) Cardiac Rhythm: (P) Normal sinus rhythm (03/22 0800) Resp:  [13-33] 13 (03/22 0800) BP: (100-145)/(42-88) 112/59 (03/22 0800) SpO2:  [90 %-97 %] 91 % (03/22 0800) Arterial Line BP: (128-137)/(51-58) 128/51 (03/21 1100) Weight:  [92.1 kg] 92.1 kg (03/22 0500)  Hemodynamic parameters for last 24 hours: PAP: (32-35)/(10-14) 32/10  Intake/Output from previous day: 03/21 0701 - 03/22 0700 In: 1137.8 [P.O.:250; I.V.:537.8; IV Piggyback:350] Out: 1610 [Urine:1035; Chest Tube:130] Intake/Output this shift: Total I/O In: 100 [IV Piggyback:100] Out: -        Exam    General- alert and comfortable    Neck- no JVD, no cervical adenopathy palpable, no carotid bruit   Lungs- clear without rales, wheezes   Cor- regular rate and rhythm, no murmur , gallop   Abdomen- soft, non-tender   Extremities - warm, non-tender, minimal edema   Neuro- oriented, appropriate, no focal weakness   Lab Results: Recent Labs    09/02/18 1731 09/03/18 0607  WBC 11.2* 11.1*  HGB 11.4* 10.9*  HCT 33.4* 32.5*  PLT 133* 118*   BMET:  Recent Labs    09/02/18 1731 09/03/18 0607  NA 135 134*  K 3.6 3.8  CL 102 101  CO2 24 24  GLUCOSE 125* 109*  BUN 12 11  CREATININE 1.27* 1.08  CALCIUM 8.7* 8.5*    PT/INR:  Recent Labs    09/01/18 1230  LABPROT 17.5*  INR 1.5*   ABG    Component Value Date/Time   PHART 7.335 (L) 09/01/2018 2258   HCO3 22.6 09/01/2018 2258   TCO2 24 09/01/2018 2258   ACIDBASEDEF 3.0 (H) 09/01/2018 2258   O2SAT 93.0 09/01/2018 2258   CBG (last  3)  Recent Labs    09/03/18 0023 09/03/18 0310 09/03/18 0802  GLUCAP 100* 110* 128*    Assessment/Plan: S/P Procedure(s) (LRB): CORONARY ARTERY BYPASS GRAFTING (CABG)  x two, using left internal mammary and right saphenous vein (endoscopic vein harvest). (N/A) TRANSESOPHAGEAL ECHOCARDIOGRAM (TEE) (N/A) Mobilize Diuresis d/c tubes/lines Plan for transfer to step-down: see transfer orders   LOS: 4 days    Larry Roberts 09/03/2018

## 2018-09-04 ENCOUNTER — Encounter (HOSPITAL_COMMUNITY): Payer: Self-pay | Admitting: Surgery

## 2018-09-04 ENCOUNTER — Inpatient Hospital Stay (HOSPITAL_COMMUNITY): Payer: Medicare Other

## 2018-09-04 LAB — CBC
HCT: 31.8 % — ABNORMAL LOW (ref 39.0–52.0)
Hemoglobin: 10.9 g/dL — ABNORMAL LOW (ref 13.0–17.0)
MCH: 29.9 pg (ref 26.0–34.0)
MCHC: 34.3 g/dL (ref 30.0–36.0)
MCV: 87.1 fL (ref 80.0–100.0)
Platelets: 129 10*3/uL — ABNORMAL LOW (ref 150–400)
RBC: 3.65 MIL/uL — ABNORMAL LOW (ref 4.22–5.81)
RDW: 12 % (ref 11.5–15.5)
WBC: 7.8 10*3/uL (ref 4.0–10.5)
nRBC: 0 % (ref 0.0–0.2)

## 2018-09-04 LAB — BASIC METABOLIC PANEL
Anion gap: 8 (ref 5–15)
BUN: 12 mg/dL (ref 8–23)
CO2: 26 mmol/L (ref 22–32)
Calcium: 8.4 mg/dL — ABNORMAL LOW (ref 8.9–10.3)
Chloride: 101 mmol/L (ref 98–111)
Creatinine, Ser: 1 mg/dL (ref 0.61–1.24)
GFR calc Af Amer: >60 mL/min (ref >60)
GFR calc non Af Amer: >60 mL/min (ref >60)
Glucose, Bld: 113 mg/dL — ABNORMAL HIGH (ref 70–99)
Potassium: 3.2 mmol/L — ABNORMAL LOW (ref 3.5–5.1)
Sodium: 135 mmol/L (ref 135–145)

## 2018-09-04 MED ORDER — POTASSIUM CHLORIDE CRYS ER 20 MEQ PO TBCR
40.0000 meq | EXTENDED_RELEASE_TABLET | Freq: Two times a day (BID) | ORAL | Status: AC
  Administered 2018-09-04 (×2): 40 meq via ORAL
  Filled 2018-09-04 (×2): qty 2

## 2018-09-04 MED ORDER — FUROSEMIDE 40 MG PO TABS
40.0000 mg | ORAL_TABLET | Freq: Every day | ORAL | Status: DC
  Administered 2018-09-05: 40 mg via ORAL
  Filled 2018-09-04 (×2): qty 1

## 2018-09-04 MED ORDER — POTASSIUM CHLORIDE CRYS ER 20 MEQ PO TBCR
20.0000 meq | EXTENDED_RELEASE_TABLET | Freq: Every day | ORAL | Status: DC
  Administered 2018-09-05: 20 meq via ORAL
  Filled 2018-09-04: qty 1

## 2018-09-04 MED FILL — Thrombin (Recombinant) For Soln 20000 Unit: CUTANEOUS | Qty: 1 | Status: AC

## 2018-09-04 NOTE — Progress Notes (Addendum)
Progress Note  Patient Name: Larry Roberts Date of Encounter: 09/04/2018  Primary Cardiologist: Kirk Ruths, MD   Subjective   No CP or dyspnea  Inpatient Medications    Scheduled Meds: . acetaminophen  1,000 mg Oral Q6H   Or  . acetaminophen (TYLENOL) oral liquid 160 mg/5 mL  1,000 mg Per Tube Q6H  . ALPRAZolam  0.25 mg Oral QHS  . aspirin EC  325 mg Oral Daily   Or  . aspirin  324 mg Per Tube Daily  . bisacodyl  10 mg Oral Daily   Or  . bisacodyl  10 mg Rectal Daily  . docusate sodium  200 mg Oral Daily  . dorzolamide-timolol  1 drop Both Eyes BID  . enoxaparin (LOVENOX) injection  40 mg Subcutaneous Q24H  . furosemide  40 mg Intravenous Daily  . latanoprost  1 drop Both Eyes QHS  . metoprolol tartrate  12.5 mg Oral BID   Or  . metoprolol tartrate  12.5 mg Per Tube BID  . pantoprazole  40 mg Oral Daily  . rosuvastatin  40 mg Oral QHS  . simethicone  80 mg Oral QID  . sodium chloride flush  3 mL Intravenous Q12H  . tamsulosin  0.4 mg Oral Q72H   Continuous Infusions: . sodium chloride     PRN Meds: sodium chloride, alum & mag hydroxide-simeth, magnesium hydroxide, metoprolol tartrate, ondansetron (ZOFRAN) IV, oxyCODONE, sodium chloride flush, traMADol   Vital Signs    Vitals:   09/03/18 1600 09/03/18 2000 09/03/18 2318 09/04/18 0318  BP: (!) 141/74 (!) 142/70 119/61 133/86  Pulse: 81 87 80 80  Resp: (!) 22 16 20 20   Temp: 98.4 F (36.9 C) 98.2 F (36.8 C) 97.9 F (36.6 C) 98.2 F (36.8 C)  TempSrc: Oral Oral Oral Oral  SpO2: 93% 95% 95% 94%  Weight:    89.7 kg  Height:        Intake/Output Summary (Last 24 hours) at 09/04/2018 0815 Last data filed at 09/03/2018 2005 Gross per 24 hour  Intake 490 ml  Output 1000 ml  Net -510 ml   Last 3 Weights 09/04/2018 09/03/2018 09/02/2018  Weight (lbs) 197 lb 12 oz 203 lb 0.7 oz 201 lb 4.5 oz  Weight (kg) 89.7 kg 92.1 kg 91.3 kg      Telemetry    Sinus- Personally Reviewed  Physical Exam   GEN:  No acute distress.   Neck: No JVD Cardiac: RRR, no murmurs, rubs, or gallops.  Respiratory: Clear to auscultation bilaterally. GI: Soft, nontender, non-distended  MS: No edema; No deformity. Neuro:  Nonfocal  Psych: Normal affect   Labs    Chemistry Recent Labs  Lab 09/02/18 1731 09/03/18 0607 09/04/18 0254  NA 135 134* 135  K 3.6 3.8 3.2*  CL 102 101 101  CO2 24 24 26   GLUCOSE 125* 109* 113*  BUN 12 11 12   CREATININE 1.27* 1.08 1.00  CALCIUM 8.7* 8.5* 8.4*  GFRNONAA 55* >60 >60  GFRAA >60 >60 >60  ANIONGAP 9 9 8      Hematology Recent Labs  Lab 09/02/18 1731 09/03/18 0607 09/04/18 0254  WBC 11.2* 11.1* 7.8  RBC 3.73* 3.64* 3.65*  HGB 11.4* 10.9* 10.9*  HCT 33.4* 32.5* 31.8*  MCV 89.5 89.3 87.1  MCH 30.6 29.9 29.9  MCHC 34.1 33.5 34.3  RDW 12.4 12.2 12.0  PLT 133* 118* 129*     Radiology    Dg Chest 2 View  Result Date:  09/04/2018 CLINICAL DATA:  Status post CABG. EXAM: CHEST - 2 VIEW COMPARISON:  09/02/2018. FINDINGS: Swan-Ganz catheter has been removed. Mediastinal tube has been removed. LEFT chest tube has been removed. There is no pneumothorax. No consolidation or edema. Mild atelectasis the RIGHT lung base. IMPRESSION: Stable chest. Electronically Signed   By: Staci Righter M.D.   On: 09/04/2018 07:10    Patient Profile     78 y.o. male multivessel coronary artery disease status post CABG x2 LIMA to LAD SVG to diagonal  Assessment & Plan    1 coronary artery disease-patient is status post coronary artery bypass and graft.  He is doing well with no recurrent chest pain.  Continue medical therapy with aspirin, statin and beta-blocker.  2 hyperlipidemia-continue Crestor at present dose.  Check lipids and liver in 6 weeks.  3 hypertension-continue metoprolol.  Follow blood pressure as an outpatient and advance regimen as needed.  4 postoperative volume excess-nearing preoperative weight.  Could likely discontinue Lasix at discharge.  5 history of  abdominal bruit-we will arrange abdominal ultrasound as an outpatient.  CARDIOLOGY RECOMMENDATIONS:  Discharge is anticipated in the next 48 hours. Recommendations for medications and follow up:  Discharge Medications: Continue medications as they are currently listed in the [facility]  Follow Up: The patient's Primary Cardiologist is Kirk Ruths, MD  Follow up in the office in 6-8 week(s).  Signed,  Kirk Ruths, MD  8:22 AM 09/04/2018  [facility]  For questions or updates, please contact [facility] Please consult [email or web address] for contact info under        Signed, Kirk Ruths, MD  09/04/2018, 8:15 AM

## 2018-09-04 NOTE — Progress Notes (Signed)
BP 93/75.  Metoprolol 12.5mg  held due to parameters of SBP<100.

## 2018-09-04 NOTE — Care Management CC44 (Signed)
Condition Code 44 Documentation Completed  Patient Details  Name: Larry Roberts MRN: [id number] Date of Birth: 09-17-1940   Condition Code 44 given:    Patient signature on Condition Code 44 notice:    Documentation of 2 MD's agreement:    Code 44 added to claim:       Orbie Pyo 09/04/2018, 3:34 PM

## 2018-09-04 NOTE — Progress Notes (Signed)
3 Days Post-Op Procedure(s) (LRB): CORONARY ARTERY BYPASS GRAFTING (CABG)  x two, using left internal mammary and right saphenous vein (endoscopic vein harvest). (N/A) TRANSESOPHAGEAL ECHOCARDIOGRAM (TEE) (N/A) Subjective: Felt rough over the weekend but better today. Ambulating independently  Objective: Vital signs in last 24 hours: Temp:  [97.9 F (36.6 C)-98.5 F (36.9 C)] 98.2 F (36.8 C) (03/23 1211) Pulse Rate:  [73-95] 95 (03/23 1211) Cardiac Rhythm: Normal sinus rhythm (03/23 0702) Resp:  [16-28] 19 (03/23 1211) BP: (93-142)/(61-86) 131/65 (03/23 1211) SpO2:  [93 %-96 %] 96 % (03/23 1211) Weight:  [89.7 kg] 89.7 kg (03/23 0318)  Hemodynamic parameters for last 24 hours:    Intake/Output from previous day: 03/22 0701 - 03/23 0700 In: 610 [P.O.:510; IV Piggyback:100] Out: 1000 [Urine:1000] Intake/Output this shift: Total I/O In: 480 [P.O.:480] Out: 300 [Urine:300]  General appearance: alert and cooperative Heart: regular rate and rhythm, S1, S2 normal, no murmur, click, rub or gallop Lungs: clear to auscultation bilaterally Extremities: extremities normal, atraumatic, no cyanosis or edema Wound: incisions ok  Lab Results: Recent Labs    09/03/18 0607 09/04/18 0254  WBC 11.1* 7.8  HGB 10.9* 10.9*  HCT 32.5* 31.8*  PLT 118* 129*   BMET:  Recent Labs    09/03/18 0607 09/04/18 0254  NA 134* 135  K 3.8 3.2*  CL 101 101  CO2 24 26  GLUCOSE 109* 113*  BUN 11 12  CREATININE 1.08 1.00  CALCIUM 8.5* 8.4*    PT/INR: No results for input(s): LABPROT, INR in the last 72 hours. ABG    Component Value Date/Time   PHART 7.335 (L) 09/01/2018 2258   HCO3 22.6 09/01/2018 2258   TCO2 24 09/01/2018 2258   ACIDBASEDEF 3.0 (H) 09/01/2018 2258   O2SAT 93.0 09/01/2018 2258   CBG (last 3)  Recent Labs    09/03/18 0023 09/03/18 0310 09/03/18 0802  GLUCAP 100* 110* 128*   CXR: mild right base atelectasis  Assessment/Plan: S/P Procedure(s) (LRB): CORONARY  ARTERY BYPASS GRAFTING (CABG)  x two, using left internal mammary and right saphenous vein (endoscopic vein harvest). (N/A) TRANSESOPHAGEAL ECHOCARDIOGRAM (TEE) (N/A)  POD 3 Hemodynamically stable in sinus rhythm. Continue Lopressor. Wt is 7-8 lbs over preop wt. Continue diuretic. Ambulation, IS. Should be ready to go home by Wednesday.    LOS: 5 days    Larry Roberts 09/04/2018

## 2018-09-04 NOTE — Progress Notes (Signed)
CARDIAC REHAB PHASE I   PRE:  Rate/Rhythm: 90 SR  BP:  Sitting: 130/78      SaO2: 95 RA  MODE:  Ambulation: 470 ft 116 peak HR  POST:  Rate/Rhythm: 94 SR  BP:  Sitting: 161/88    SaO2: 96 RA   Pt ambulated 446ft in hallway independently with steady gait. Pt denies CP or SOB. Pt returned to bed, call bell and phone within reach. Encouraged continued IS use and another walk later today. Will continue to follow.  [id number] Rufina Falco, RN BSN 09/04/2018 10:48 AM

## 2018-09-05 ENCOUNTER — Telehealth: Payer: Self-pay

## 2018-09-05 LAB — BASIC METABOLIC PANEL
Anion gap: 8 (ref 5–15)
BUN: 10 mg/dL (ref 8–23)
CHLORIDE: 103 mmol/L (ref 98–111)
CO2: 24 mmol/L (ref 22–32)
Calcium: 8.9 mg/dL (ref 8.9–10.3)
Creatinine, Ser: 1.01 mg/dL (ref 0.61–1.24)
GFR calc Af Amer: >60 mL/min (ref >60)
GFR calc non Af Amer: >60 mL/min (ref >60)
Glucose, Bld: 176 mg/dL — ABNORMAL HIGH (ref 70–99)
Potassium: 3.5 mmol/L (ref 3.5–5.1)
Sodium: 135 mmol/L (ref 135–145)

## 2018-09-05 LAB — GLUCOSE, CAPILLARY
Glucose-Capillary: 116 mg/dL — ABNORMAL HIGH (ref 70–99)
Glucose-Capillary: 113 mg/dL — ABNORMAL HIGH (ref 70–99)

## 2018-09-05 MED ORDER — MENTHOL 3 MG MT LOZG
1.0000 | LOZENGE | OROMUCOSAL | Status: DC | PRN
  Administered 2018-09-05: 3 mg via ORAL
  Filled 2018-09-05: qty 9

## 2018-09-05 MED FILL — Electrolyte-R (PH 7.4) Solution: INTRAVENOUS | Qty: 4000 | Status: AC

## 2018-09-05 MED FILL — Sodium Chloride IV Soln 0.9%: INTRAVENOUS | Qty: 2000 | Status: AC

## 2018-09-05 MED FILL — Heparin Sodium (Porcine) Inj 1000 Unit/ML: INTRAMUSCULAR | Qty: 30 | Status: AC

## 2018-09-05 MED FILL — Magnesium Sulfate Inj 50%: INTRAMUSCULAR | Qty: 10 | Status: AC

## 2018-09-05 MED FILL — Lidocaine HCl(Cardiac) IV PF Soln Pref Syr 100 MG/5ML (2%): INTRAVENOUS | Qty: 5 | Status: AC

## 2018-09-05 MED FILL — Sodium Bicarbonate IV Soln 8.4%: INTRAVENOUS | Qty: 50 | Status: AC

## 2018-09-05 MED FILL — Heparin Sodium (Porcine) Inj 1000 Unit/ML: INTRAMUSCULAR | Qty: 10 | Status: AC

## 2018-09-05 MED FILL — Mannitol IV Soln 20%: INTRAVENOUS | Qty: 500 | Status: AC

## 2018-09-05 MED FILL — Potassium Chloride Inj 2 mEq/ML: INTRAVENOUS | Qty: 40 | Status: AC

## 2018-09-05 NOTE — Telephone Encounter (Signed)
RECEIVED MESSAGE: Larry Roberts  P Cv Div Nl Toc Please schedule a cardiology follow-up appointment for this patient status post CABG on 320 by Dr. Cyndia Bent. Pt is post cabg and has an appt to be seen in May, however pt needs a TOC call in 10days.  Thanks  Trish   PT IS STILL IN HOSP-08/30/2018 - present (6 days);[place]

## 2018-09-05 NOTE — Progress Notes (Signed)
Epicardial pacing wires removed per order. Pt tolerated well. Wire ends intact. Vitals stable and being monitored every 15 minutes, per protocol. Sites clean and dry. Bedrest x1 hour. Pt aware. Call light and phone within reach. Will continue to monitor.    Ara Kussmaul BSN, RN

## 2018-09-05 NOTE — Progress Notes (Addendum)
[address] HeightsSuite [address]       [place] [address]             [phone]      4 Days Post-Op Procedure(s) (LRB): CORONARY ARTERY BYPASS GRAFTING (CABG)  x two, using left internal mammary and right saphenous vein (endoscopic vein harvest). (N/A) TRANSESOPHAGEAL ECHOCARDIOGRAM (TEE) (N/A) Subjective: Looks and feels well, much less cough c/w yesterday per patient  Objective: Vital signs in last 24 hours: Temp:  [97.9 F (36.6 C)-98.5 F (36.9 C)] 97.9 F (36.6 C) (03/24 0357) Pulse Rate:  [88-105] 90 (03/24 0357) Cardiac Rhythm: Normal sinus rhythm (03/24 0430) Resp:  [16-29] 20 (03/24 0357) BP: (93-131)/(65-80) 130/75 (03/24 0357) SpO2:  [93 %-100 %] 100 % (03/24 0357) Weight:  [87.7 kg] 87.7 kg (03/24 0357)  Hemodynamic parameters for last 24 hours:    Intake/Output from previous day: 03/23 0701 - 03/24 0700 In: 820 [P.O.:820] Out: 1800 [Urine:1800] Intake/Output this shift: No intake/output data recorded.  General appearance: alert, cooperative and no distress Heart: regular rate and rhythm Lungs: clear to auscultation bilaterally Abdomen: benign Extremities: no edema Wound: incis healing well  Lab Results: Recent Labs    09/03/18 0607 09/04/18 0254  WBC 11.1* 7.8  HGB 10.9* 10.9*  HCT 32.5* 31.8*  PLT 118* 129*   BMET:  Recent Labs    09/03/18 0607 09/04/18 0254  NA 134* 135  K 3.8 3.2*  CL 101 101  CO2 24 26  GLUCOSE 109* 113*  BUN 11 12  CREATININE 1.08 1.00  CALCIUM 8.5* 8.4*    PT/INR: No results for input(s): LABPROT, INR in the last 72 hours. ABG    Component Value Date/Time   PHART 7.335 (L) 09/01/2018 2258   HCO3 22.6 09/01/2018 2258   TCO2 24 09/01/2018 2258   ACIDBASEDEF 3.0 (H) 09/01/2018 2258   O2SAT 93.0 09/01/2018 2258   CBG (last 3)  Recent Labs    09/03/18 0023 09/03/18 0310 09/03/18 0802  GLUCAP 100* 110* 128*    Meds Scheduled Meds: . acetaminophen  1,000 mg Oral Q6H   Or  . acetaminophen  (TYLENOL) oral liquid 160 mg/5 mL  1,000 mg Per Tube Q6H  . ALPRAZolam  0.25 mg Oral QHS  . aspirin EC  325 mg Oral Daily   Or  . aspirin  324 mg Per Tube Daily  . bisacodyl  10 mg Oral Daily   Or  . bisacodyl  10 mg Rectal Daily  . docusate sodium  200 mg Oral Daily  . dorzolamide-timolol  1 drop Both Eyes BID  . enoxaparin (LOVENOX) injection  40 mg Subcutaneous Q24H  . furosemide  40 mg Oral Daily  . latanoprost  1 drop Both Eyes QHS  . metoprolol tartrate  12.5 mg Oral BID   Or  . metoprolol tartrate  12.5 mg Per Tube BID  . pantoprazole  40 mg Oral Daily  . potassium chloride  20 mEq Oral Daily  . rosuvastatin  40 mg Oral QHS  . simethicone  80 mg Oral QID  . sodium chloride flush  3 mL Intravenous Q12H  . tamsulosin  0.4 mg Oral Q72H   Continuous Infusions: . sodium chloride     PRN Meds:.sodium chloride, alum & mag hydroxide-simeth, magnesium hydroxide, metoprolol tartrate, ondansetron (ZOFRAN) IV, oxyCODONE, sodium chloride flush, traMADol  Xrays Dg Chest 2 View  Result Date: 09/04/2018 CLINICAL DATA:  Status post CABG. EXAM: CHEST - 2 VIEW COMPARISON:  09/02/2018. FINDINGS: Swan-Ganz catheter has been removed. Mediastinal tube has been removed. LEFT chest tube has been removed. There is no pneumothorax. No consolidation or edema. Mild atelectasis the RIGHT lung base. IMPRESSION: Stable chest. Electronically Signed   By: Staci Righter M.D.   On: 09/04/2018 07:10    Assessment/Plan: S/P Procedure(s) (LRB): CORONARY ARTERY BYPASS GRAFTING (CABG)  x two, using left internal mammary and right saphenous vein (endoscopic vein harvest). (N/A) TRANSESOPHAGEAL ECHOCARDIOGRAM (TEE) (N/A)  1 conts to show steady improvement 2 hemodyn stable in sinus rhythm 3 will recheck BMET as needed K+ yesterday 4 excellent diuresis, should be able to stop diuretic soon 5 d/c epw's today 6 poss home in am  LOS: 6 days    Larry Roberts [facility] 09/05/2018 Pager [phone]   Chart  reviewed, patient examined, agree with above. He feels much better today. Continues to diurese. No peripheral edema so I don't think he needs to go home on lasix. Keep chest tube sutures in for another week and his daughter can take them out at home.  I would plan to see him back in 4 weeks since his daughter can keep an eye on him at home.

## 2018-09-05 NOTE — Discharge Instructions (Signed)
Coronary Artery Bypass Grafting, Care After This sheet gives you information about how to care for yourself after your procedure. Your doctor may also give you more specific instructions. If you have problems or questions, contact your doctor. Follow these instructions at home: Medicines  Take over-the-counter and prescription medicines only as told by your doctor. Do not stop taking medicines or start any new medicines unless your doctor says it is okay.  If you were prescribed an antibiotic medicine, take it as told by your doctor. Do not stop taking the antibiotic even if you start to feel better.  Do not drive or use heavy machinery while taking prescription pain medicine. Incision care      Follow instructions from your doctor about how to take care of your incisions. Make sure you: ? Wash your hands with soap and water before you change your bandage (dressing). If you cannot use soap and water, use hand sanitizer. ? Change your dressing as told by your doctor. ? Leave stitches (sutures), skin glue, or skin tape (adhesive) strips in place. They may need to stay in place for 2 weeks or longer. If tape strips get loose and curl up, you may trim the loose edges. Do not remove tape strips completely unless your doctor says it is okay.  Make sure the incisions are clean, dry, and protected.  Check your incision areas every day for signs of infection. Check for: ? Redness, swelling, or pain. ? Fluid or blood. ? Warmth. ? Pus or a bad smell.  If cuts were made in your legs: ? Avoid crossing your legs. ? Avoid sitting for long periods of time. Change positions every 30 minutes. ? Raise (elevate) your legs when you are sitting. Bathing  Do not take baths, swim, or use a hot tub until your doctor says it is okay. You may shower. Pat the incisions dry. Do not rub the incisions to dry   Endoscopic Saphenous Vein Harvesting, Care After Refer to this sheet in the next few weeks. These  instructions provide you with information about caring for yourself after your procedure. Your health care provider may also give you more specific instructions. Your treatment has been planned according to current medical practices, but problems sometimes occur. Call your health care provider if you have any problems or questions after your procedure. What can I expect after the procedure? After the procedure, it is common to have: Pain. Bruising. Swelling. Numbness. Follow these instructions at home: Medicine Take over-the-counter and prescription medicines only as told by your health care provider. Do not drive or operate heavy machinery while taking prescription pain medicine. Incision care  Follow instructions from your health care provider about how to take care of the cut made during surgery (incision). Make sure you: Wash your hands with soap and water before you change your bandage (dressing). If soap and water are not available, use hand sanitizer. Change your dressing as told by your health care provider. Leave stitches (sutures), skin glue, or adhesive strips in place. These skin closures may need to be in place for 2 weeks or longer. If adhesive strip edges start to loosen and curl up, you may trim the loose edges. Do not remove adhesive strips completely unless your health care provider tells you to do that. Check your incision area every day for signs of infection. Check for: More redness, swelling, or pain. More fluid or blood. Warmth. Pus or a bad smell. General instructions Raise (elevate) your legs above the  level of your heart while you are sitting or lying down. Do any exercises your health care providers have given you. These may include deep breathing, coughing, and walking exercises. Do not shower, take baths, swim, or use a hot tub unless told by your health care provider. Wear your elastic stocking if told by your health care provider. Keep all follow-up visits as  told by your health care provider. This is important. Contact a health care provider if: Medicine does not help your pain. Your pain gets worse. You have new leg bruises or your leg bruises get bigger. You have a fever. Your leg feels numb. You have more redness, swelling, or pain around your incision. You have more fluid or blood coming from your incision. Your incision feels warm to the touch. You have pus or a bad smell coming from your incision. Get help right away if: Your pain is severe. You develop pain, tenderness, warmth, redness, or swelling in any part of your leg. You have chest pain. You have trouble breathing. This information is not intended to replace advice given to you by your health care provider. Make sure you discuss any questions you have with your health care provider. Document Released: 02/10/2011 Document Revised: 11/06/2015 Document Reviewed: 04/14/2015 Elsevier Interactive Patient Education  2019 [employer] and drinking  Eat foods that are high in fiber, such as raw fruits and vegetables, whole grains, beans, and nuts. Meats should be lean cut. Avoid canned, processed, and fried foods. This can help prevent constipation. This is also a recommended part of a heart-healthy diet.  Drink enough fluid to keep your urine clear or pale yellow.  Limit alcohol intake to no more than 1 drink a day for nonpregnant women and 2 drinks a day for men. One drink equals 12 oz of beer, 5 oz of wine, or 1 oz of hard liquor. Activity  Rest and limit your activity as told by your doctor. You may be told to: ? Stop any activity right away if you have chest pain, shortness of breath, irregular heartbeats, or dizziness. Get help right away if you have any of these symptoms. ? Move around often for short periods or take short walks as told by your doctor. Slowly increase your activities. You may need physical therapy or cardiac rehabilitation. ? Avoid lifting,  pushing, or pulling anything that is heavier than 10 lb (4.5 kg) for at least 6 weeks or as told by your doctor.  Do not drive until your doctor says it is okay.  Ask your doctor when you can go back to work.  Ask your doctor when you can be sexually active. General instructions   Do not use any products that contain nicotine or tobacco, such as cigarettes and e-cigarettes. If you need help quitting, ask your doctor.  Take 2-3 deep breaths every few hours during the day while you get better. This helps expand your lungs and prevent complications.  If you were given a device called an incentive spirometer, use it several times a day to practice deep breathing. Support your chest with a pillow or your arms when you take deep breaths or cough.  Wear compression stockings as told by your doctor. These stockings help to prevent blood clots and reduce swelling in your legs.  Weigh yourself every day. This helps to see if your body is holding (retaining) fluid that may make your heart and lungs work harder.  Keep all follow-up visits as told by  your doctor. This is important. Contact a doctor if:  You have more redness, swelling, or pain around any cut.  You have more fluid or blood coming from any cut.  Any cut feels warm to the touch.  You have pus or a bad smell coming from any cut.  You have a fever.  You have swelling in your ankles or legs.  You have pain in your legs.  You gain 2 or more pounds (0.9 kg) a day.  You feel sick to your stomach (nauseous) or throw up (vomit).  You have watery poop (diarrhea). Get help right away if:  You have chest pain that goes to your jaw or arms.  You are short of breath.  You have a fast or irregular heartbeat.  You notice a "clicking" in your breastbone (sternum) when you move.  You feel numb or weak in your arms or legs.  You feel dizzy or light-headed. Summary  After the procedure, it is common to have pain or discomfort  in the incision areas.  Do not take baths, swim, or use a hot tub until your health care provider approves.  Slowly increase your activities. You may need physical therapy or cardiac rehabilitation.  Weigh yourself every day. This helps to see if your body is holding (retaining) fluid that may make your heart and lungs work harder. This information is not intended to replace advice given to you by your health care provider. Make sure you discuss any questions you have with your health care provider. Document Released: 06/05/2013 Document Revised: 07/13/2016 Document Reviewed: 07/13/2016 Elsevier Interactive Patient Education  2019 [employer].

## 2018-09-05 NOTE — Progress Notes (Signed)
Pt ambulated this am 470 ft independently. Sts he will walk more independently today. Ed completed with great reception. Will refer to [place]. Gave video to watch.  [place], ACSM 9:44 AM 09/05/2018

## 2018-09-05 NOTE — Discharge Summary (Signed)
Physician Discharge Summary  Patient ID: Larry Roberts MRN: [id number] DOB/AGE: 21-May-1941 78 y.o.  Admit date: 08/30/2018 Discharge date: 09/06/2018  Admission Diagnoses: Crescendo angina  Discharge Diagnoses:  Principal Problem:   Angina pectoris, crescendo [facility]) Active Problems:   Malignant neoplasm of prostate ([place])   Abnormal nuclear stress test   Crescendo angina ([facility])   S/P CABG x 2  Patient Active Problem List   Diagnosis Date Noted  . S/P CABG x 2 09/01/2018  . Abnormal nuclear stress test 08/30/2018  . Angina pectoris, crescendo ([place]) 08/30/2018  . Crescendo angina ([place]) 08/30/2018  . Malignant neoplasm of prostate ([place]) 10/03/2017   History of Present Illness:  at time of consultation   The patient is a 78 year old male who has recently begun an for chest pain symptoms.  For the past approximate 4 to 5 months he has developed a burning chest discomfort with some radiation to his throat and right anterior shoulder area,  associated with dyspnea with more vigorous activities that is relieved with a few minutes rest.  He has not had any of the symptoms with routine activity.  A nuclear stress test was done which revealed 2 to 3 mm of ST depression in the inferolateral lateral leads and a 72mm ST elevation in lead aVL.  His images did reveal findings consistent with ischemia in the anteroseptal wall and apex which appears to be transient ischemic dilatation.  Due to these findings he was subsequently felt to require cardiac catheterization which was done on today's date revealing severe multivessel coronary artery disease.  We are asked to see the patient in cardiothoracic surgical consultation for consideration of coronary artery surgical revascularization.  Please see the full report listed below.  He does have cardiac risk factors inclusive of hypertension and hyperlipidemia as well as remote tobacco abuse having quit cigarettes in 1988.  He is recently been diagnosed with  prostate cancer.   Discharged Condition: good  Hospital Course: The patient was admitted for elective cardiac catheterization and was found to have severe LAD/diagonal disease.  Due to these findings cardiothoracic surgical consultation was obtained and he was felt to be a candidate to proceed with CABG.  On 09/01/2018 he was taken to the operating room where he underwent the below described procedure.  He tolerated the procedure well and was taken to the surgical intensive care unit stable condition.  Postoperative hospital course:  The patient has done well.  He was weaned from the ventilator, neurologically intact using standard protocols.  All routine lines, monitors and drainage devices were discontinued in the standard fashion.  He has maintained stable hemodynamics and sinus rhythm.  He has a moderate acute blood loss anemia which is stable.  Most recent hemoglobin hematocrit are 10.9/31.8.  He has had some postoperative volume overload which has responded well to gentle diuresis.  He appears euvolemic at this time and will not require further as an outpatient.  Renal function is normal with most recent BUN and creatinine 10/1.01.  Incisions are noted to be healing well without evidence of infection.  Oxygen has been weaned and maintains good saturations on room air.  He is tolerating routine cardiac rehab modalities.  At the time of discharge the patient is felt to be quite stable.  Consults: cardiology  Significant Diagnostic Studies: angiography: cardiac catheterization  Treatments: surgery:  CARDIOVASCULAR SURGERY OPERATIVE NOTE  09/01/2018  Surgeon:  Gaye Pollack, MD  First Assistant: Jadene Pierini,  PA-C   Preoperative Diagnosis:  Severe multi-vessel coronary artery disease   Postoperative Diagnosis:  Same   Procedure:  1. Median Sternotomy 2. Extracorporeal circulation 3.   Coronary artery  bypass grafting x 2   Left internal mammary graft to the LAD  SVG to diagonal 2   4.   Endoscopic vein harvest from the right leg   Anesthesia:  General Endotracheal Discharge Exam: Blood pressure 123/69, pulse 81, temperature 97.8 F (36.6 C), temperature source Oral, resp. rate 16, height 5\' 10"  (1.778 m), weight 87 kg, SpO2 96 %.   General appearance: alert, cooperative and no distress Heart: regular rate and rhythm Lungs: clear to auscultation bilaterally Abdomen: benign Extremities: no edema Wound: incis healing well  Disposition: Discharge disposition: 01-Home or Self Care       Discharge Instructions    Amb Referral to Cardiac Rehabilitation   Complete by:  As directed    Diagnosis:  CABG   CABG X ___:  2   Discharge patient   Complete by:  As directed    Discharge disposition:  01-Home or Self Care   Discharge patient date:  09/06/2018     Allergies as of 09/06/2018      Reactions   Tetanus Toxoids Other (See Comments)   SYNCOPE "black out"   Levaquin [levofloxacin] Itching      Medication List    STOP taking these medications   ALPRAZolam 0.5 MG tablet Commonly known as:  XANAX     TAKE these medications   acetaminophen 500 MG tablet Commonly known as:  TYLENOL Take 500 mg by mouth every 6 (six) hours as needed (pain/headaches.).   amLODipine 5 MG tablet Commonly known as:  NORVASC Take 5 mg by mouth daily.   aspirin 325 MG EC tablet Take 1 tablet (325 mg total) by mouth daily. What changed:    medication strength  how much to take   carboxymethylcellulose 0.5 % Soln Commonly known as:  REFRESH PLUS Place 1 drop into both eyes 3 (three) times daily as needed (dry/irritated eyes.).   CoQ10 100 MG Caps Take 100 mg by mouth daily.   dorzolamide-timolol 22.3-6.8 MG/ML ophthalmic solution Commonly known as:  COSOPT Place 1 drop into both eyes 2 (two) times daily.   latanoprost 0.005 % ophthalmic solution Commonly known as:   XALATAN Place 1 drop into both eyes at bedtime.   metoprolol succinate 25 MG 24 hr tablet Commonly known as:  Toprol XL Take 1 tablet (25 mg total) by mouth daily.   Osteo Bi-Flex Joint Shield Tabs Take 2 tablets by mouth daily.   ranitidine 150 MG tablet Commonly known as:  ZANTAC Take 150 mg by mouth every other day.   rosuvastatin 40 MG tablet Commonly known as:  CRESTOR Take 1 tablet (40 mg total) by mouth at bedtime.   tamsulosin 0.4 MG Caps capsule Commonly known as:  Flomax Take 1 capsule (0.4 mg total) by mouth daily after supper. What changed:  when to take this   traMADol 50 MG tablet Commonly known as:  ULTRAM Take 1 tablet (50 mg total) by mouth every 6 (six) hours as needed for up to 7 days for moderate pain.      Follow-up Information    Gaye Pollack, MD Follow up.   Specialty:  Cardiothoracic Surgery Why:  Please see discharge paperwork for follow-up appointment.  Please also obtain a chest x-ray at [facility] 1/2hour prior to appointment with Dr. Cyndia Bent.  It is located in the same office complex. Contact information: [address] [place] [address] [phone]        Belva Crome, MD Follow up.   Specialty:  Cardiology Why:  Please see discharge paperwork  Contact information: [address] [place] [address] [phone]          The patient has been discharged on:   1.Beta Blocker:  Yes y                              No                                 If No, reason:  2.Ace Inhibitor/ARB: Yes                                     No n                                     If No, reason:BP control to labile to initiate at this time  3.Statin:   Yes y                  No                  If No, reason:  4.Ecasa:  Yes y                  No                     If No, reason:  Signed: John Giovanni 09/06/2018, 8:03 AM

## 2018-09-06 MED ORDER — TRAMADOL HCL 50 MG PO TABS: 50.0000 mg | ORAL_TABLET | Freq: Four times a day (QID) | ORAL | 0 refills | Status: AC | PRN

## 2018-09-06 MED ORDER — ASPIRIN 325 MG PO TBEC: 325.0000 mg | DELAYED_RELEASE_TABLET | Freq: Every day | ORAL | Status: DC

## 2018-09-06 MED ORDER — POTASSIUM CHLORIDE CRYS ER 20 MEQ PO TBCR
40.0000 meq | EXTENDED_RELEASE_TABLET | Freq: Once | ORAL | Status: AC
  Administered 2018-09-06: 40 meq via ORAL
  Filled 2018-09-06: qty 2

## 2018-09-06 NOTE — Progress Notes (Signed)
CARDIAC REHAB PHASE I   Pt ambulating independently. Encouraged continued walks, and IS use. Pt denies questions or concerns regarding discharge. Education completed yesterday. Pt referred to [facility] [place], pt aware they will reach out once they re-open.  [phone] Rufina Falco, RN BSN 09/06/2018 8:49 AM

## 2018-09-06 NOTE — Progress Notes (Signed)
Pt discharged home with wife, per order. IV and telemetry box removed. Pt received discharge instructions and all questions were answered. Pt left with all of his belongings. Pt discharged via wheelchair and was accompanied by a [employer] employee.  Ara Kussmaul BSN, RN

## 2018-09-06 NOTE — Progress Notes (Addendum)
[address]       [place] [address]             [phone]      5 Days Post-Op Procedure(s) (LRB): CORONARY ARTERY BYPASS GRAFTING (CABG)  x two, using left internal mammary and right saphenous vein (endoscopic vein harvest). (N/A) TRANSESOPHAGEAL ECHOCARDIOGRAM (TEE) (N/A) Subjective: Feels well, mild cough last night, improved this am  Objective: Vital signs in last 24 hours: Temp:  [97.8 F (36.6 C)-98.7 F (37.1 C)] 97.8 F (36.6 C) (03/25 0403) Pulse Rate:  [69-92] 81 (03/25 0403) Cardiac Rhythm: Normal sinus rhythm (03/24 1902) Resp:  [14-26] 16 (03/25 0403) BP: (111-144)/(62-90) 123/69 (03/25 0403) SpO2:  [96 %-98 %] 96 % (03/25 0403) Weight:  [87 kg] 87 kg (03/25 0410)  Hemodynamic parameters for last 24 hours:    Intake/Output from previous day: 03/24 0701 - 03/25 0700 In: 1203 [P.O.:1200; I.V.:3] Out: -  Intake/Output this shift: No intake/output data recorded.  General appearance: alert, cooperative and no distress Heart: regular rate and rhythm Lungs: clear to auscultation bilaterally Abdomen: benign Extremities: no edema Wound: incis healing well  Lab Results: Recent Labs    09/04/18 0254  WBC 7.8  HGB 10.9*  HCT 31.8*  PLT 129*   BMET:  Recent Labs    09/04/18 0254 09/05/18 0825  NA 135 135  K 3.2* 3.5  CL 101 103  CO2 26 24  GLUCOSE 113* 176*  BUN 12 10  CREATININE 1.00 1.01  CALCIUM 8.4* 8.9    PT/INR: No results for input(s): LABPROT, INR in the last 72 hours. ABG    Component Value Date/Time   PHART 7.335 (L) 09/01/2018 2258   HCO3 22.6 09/01/2018 2258   TCO2 24 09/01/2018 2258   ACIDBASEDEF 3.0 (H) 09/01/2018 2258   O2SAT 93.0 09/01/2018 2258   CBG (last 3)  Recent Labs    09/03/18 0802  GLUCAP 128*    Meds Scheduled Meds: . acetaminophen  1,000 mg Oral Q6H   Or  . acetaminophen (TYLENOL) oral liquid 160 mg/5 mL  1,000 mg Per Tube Q6H  . ALPRAZolam  0.25 mg Oral QHS  . aspirin EC   325 mg Oral Daily   Or  . aspirin  324 mg Per Tube Daily  . bisacodyl  10 mg Oral Daily   Or  . bisacodyl  10 mg Rectal Daily  . docusate sodium  200 mg Oral Daily  . dorzolamide-timolol  1 drop Both Eyes BID  . enoxaparin (LOVENOX) injection  40 mg Subcutaneous Q24H  . furosemide  40 mg Oral Daily  . latanoprost  1 drop Both Eyes QHS  . metoprolol tartrate  12.5 mg Oral BID   Or  . metoprolol tartrate  12.5 mg Per Tube BID  . pantoprazole  40 mg Oral Daily  . potassium chloride  20 mEq Oral Daily  . rosuvastatin  40 mg Oral QHS  . simethicone  80 mg Oral QID  . sodium chloride flush  3 mL Intravenous Q12H  . tamsulosin  0.4 mg Oral Q72H   Continuous Infusions: . sodium chloride     PRN Meds:.sodium chloride, alum & mag hydroxide-simeth, magnesium hydroxide, menthol-cetylpyridinium, metoprolol tartrate, ondansetron (ZOFRAN) IV, oxyCODONE, sodium chloride flush, traMADol  Xrays No results found.  Assessment/Plan: S/P Procedure(s) (LRB): CORONARY ARTERY BYPASS GRAFTING (CABG)  x two, using left internal mammary and right saphenous vein (endoscopic vein harvest). (N/A) TRANSESOPHAGEAL ECHOCARDIOGRAM (TEE) (N/A)  1 conts to do well 2 afeb, VSS, sats good on RA, BP should tolerate resuming home norvasc 3 no new labs, replacing K+ 4 BS ok 5 weight stable , no significant volume overload, will not send home on lasix 6 stable for discharge  LOS: 7 days    [facility] 09/06/2018 Pager [phone]   Chart reviewed, patient examined, agree with above. He looks good this am and can go home. Chest tube sutures out in another week by his daughter at home. I want to see in a month with CXR.

## 2018-09-07 ENCOUNTER — Telehealth (HOSPITAL_COMMUNITY): Payer: Self-pay

## 2018-09-07 NOTE — Telephone Encounter (Signed)
Pt insurance is active and benefits verified through [employer] $20.00, DED 0/0 met, out of pocket $4,500/$54.00 met, co-insurance 0. no pre-authorization required. Passport, 09/07/2018 @ 1:38pm, REF# [id number]  Will contact patient to see if he is interested in the Cardiac Rehab Program. If interested, patient will need to complete follow up appt. Once completed, patient will be contacted for scheduling upon review by the RN Navigator.

## 2018-09-07 NOTE — Telephone Encounter (Signed)
Called pt to see if he was interested in participating in the cardiac rehab program pt stated that he was not interested at the time and explained that we are closed for the next 4 weeks or so that I could call him at a later time to schedule pt stated that he would like a call back once we start back scheduling to see if he was interested.  Tedra Sen[place]. Support Rep II

## 2018-09-07 NOTE — Telephone Encounter (Signed)
Patient needs TOC appointment.

## 2018-09-19 ENCOUNTER — Encounter (HOSPITAL_COMMUNITY): Payer: Self-pay | Admitting: Surgery

## 2018-09-19 LAB — ECHO INTRAOPERATIVE TEE
AV Mean grad: 2 mmHg — NL
Height: 70 in — NL
Mean grad: 0 mmHg — NL
STJ: 2.5 cm — NL
Sinus: 3 cm — NL
Weight: 3054.69 oz — NL

## 2018-10-02 ENCOUNTER — Other Ambulatory Visit: Payer: Self-pay | Admitting: Surgery

## 2018-10-02 DIAGNOSIS — Z951 Presence of aortocoronary bypass graft: Secondary | ICD-10-CM

## 2018-10-04 ENCOUNTER — Telehealth: Payer: Self-pay | Admitting: Surgery

## 2018-10-04 ENCOUNTER — Telehealth (INDEPENDENT_AMBULATORY_CARE_PROVIDER_SITE_OTHER): Payer: Self-pay | Admitting: Surgery

## 2018-10-04 ENCOUNTER — Ambulatory Visit
Admission: RE | Admit: 2018-10-04 | Discharge: 2018-10-04 | Disposition: A | Discharge status: Home / Self Care | Payer: Medicare Other | Source: Ambulatory Visit | Attending: Surgery | Admitting: Surgery

## 2018-10-04 DIAGNOSIS — Z951 Presence of aortocoronary bypass graft: Secondary | ICD-10-CM

## 2018-10-04 NOTE — Telephone Encounter (Signed)
[address]       [place] [address]             [phone]     CARDIOTHORACIC SURGERY TELEPHONE VIRTUAL OFFICE NOTE  Referring Provider is No ref. provider found Primary Cardiologist is Kirk Ruths, MD PCP is Wenda Low, MD   HPI:  I spoke with Larry Roberts (DOB 06-17-1940 ) via telephone on 10/04/2018 at 11:40 AM and verified that I was speaking with the correct person using more than one form of identification.  We discussed the reason(s) for conducting our visit virtually instead of in-person.  The patient expressed understanding the circumstances and agreed to proceed as described.   Larry Roberts underwent coronary bypass graft surgery x2 on 09/01/2018.  He had an uneventful postoperative course and was discharged home on postoperative day 5.  He feels like he has been doing very well.  He said he is currently walking 1-1/2 miles twice daily without difficulty.  He has had no chest pain or shortness of breath.  He said that his incisions are healing well and there is no peripheral edema.   Current Outpatient Medications  Medication Sig Dispense Refill  . acetaminophen (TYLENOL) 500 MG tablet Take 500 mg by mouth every 6 (six) hours as needed (pain/headaches.).    Marland Kitchen amLODipine (NORVASC) 5 MG tablet Take 5 mg by mouth daily.   0  . aspirin EC 325 MG EC tablet Take 1 tablet (325 mg total) by mouth daily.    . carboxymethylcellulose (REFRESH PLUS) 0.5 % SOLN Place 1 drop into both eyes 3 (three) times daily as needed (dry/irritated eyes.).    Marland Kitchen Coenzyme Q10 (COQ10) 100 MG CAPS Take 100 mg by mouth daily.    . dorzolamide-timolol (COSOPT) 22.3-6.8 MG/ML ophthalmic solution Place 1 drop into both eyes 2 (two) times daily.   6  . latanoprost (XALATAN) 0.005 % ophthalmic solution Place 1 drop into both eyes at bedtime.   2  . metoprolol succinate (TOPROL XL) 25 MG 24 hr tablet Take 1 tablet (25 mg total) by mouth daily. 90 tablet 3  . Misc Natural  Products (OSTEO BI-FLEX JOINT SHIELD) TABS Take 2 tablets by mouth daily.    . ranitidine (ZANTAC) 150 MG tablet Take 150 mg by mouth every other day.     . rosuvastatin (CRESTOR) 40 MG tablet Take 1 tablet (40 mg total) by mouth at bedtime. 90 tablet 3  . tamsulosin (FLOMAX) 0.4 MG CAPS capsule Take 1 capsule (0.4 mg total) by mouth daily after supper. (Patient taking differently: Take 0.4 mg by mouth every 3 (three) days. ) 30 capsule 5   No current facility-administered medications for this visit.      Diagnostic Tests:  CLINICAL DATA:  One month follow-up after CABG.  EXAM: CHEST - 2 VIEW  COMPARISON:  09/04/2018 and earlier.  FINDINGS: Sternotomy for CABG. Cardiac silhouette normal in size, unchanged. Thoracic aorta minimally tortuous and atherosclerotic, unchanged. Hilar and mediastinal contours otherwise unremarkable. Lungs clear. Bronchovascular markings normal. Pulmonary vascularity normal. Very small residual LEFT pleural effusion. No RIGHT pleural effusion. No pneumothorax. Nipple shadows project over both lung bases mimicking nodules, seen on prior examinations. Degenerative changes involving the thoracic spine.  IMPRESSION: 1. Very small residual LEFT pleural effusion. 2. No acute cardiopulmonary disease otherwise.   Electronically Signed   By: Evangeline Dakin M.D.   On: 10/04/2018 08:39   Impression:  Larry Roberts is making a great  recovery 1 month following coronary bypass graft surgery.  He is already walking 1-1/2 miles twice daily.  I told him he could return to driving a car at this time but should refrain from lifting anything heavier than 10 pounds for 3 months postoperatively.  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  I asked him not to return to playing golf for at least 3 months postoperatively.  Plan:  He is going to follow-up with Dr. Stanford Breed on 10/27/2018.  He will continue to follow-up  with Dr. Lysle Rubens for his primary care.  I will be happy to see him back as the need arises.    I discussed limitations of evaluation and management via telephone.  The patient was advised to call back for repeat telephone consultation or to seek an in-person evaluation if questions arise or the patient's clinical condition changes in any significant manner.  I spent 10 minutes of non-face-to-face time during the conduct of this telephone virtual office consultation.    Gaye Pollack, MD 10/04/2018 11:40 AM

## 2018-10-09 ENCOUNTER — Other Ambulatory Visit: Payer: Self-pay | Admitting: Radiation Oncology

## 2018-10-25 ENCOUNTER — Telehealth: Payer: Self-pay | Admitting: Cardiology

## 2018-10-26 NOTE — Progress Notes (Signed)
Virtual Visit via Video Note changed to phone visit at patient request (no smart phone)   This visit type was conducted due to national recommendations for restrictions regarding the COVID-19 Pandemic (e.g. social distancing) in an effort to limit this patient's exposure and mitigate transmission in our community.  Due to his co-morbid illnesses, this patient is at least at moderate risk for complications without adequate follow up.  This format is felt to be most appropriate for this patient at this time.  All issues noted in this document were discussed and addressed.  A limited physical exam was performed with this format.  Please refer to the patient's chart for his consent to telehealth for [facility].   Date:  10/27/2018   ID:  Larry Roberts, DOB [birth year], MRN [id number]  Patient Location: Home Provider Location: Home  PCP:  Wenda Low, MD  Cardiologist:  Kirk Ruths, MD   Evaluation Performed:  Follow-Up Visit  Chief Complaint:  FU CAD  History of Present Illness:    FU CAD.  Nuclear study March 2020 significantly abnormal.  Cardiac catheterization March 2020 showed a 99% proximal LAD, 90% ostial first diagonal, 70% apical LAD, 60% circumflex and 60% right coronary artery.  Carotid Dopplers March 2020 showed 1 to 39% bilateral stenosis. Echo March 2020 showed normal LV function, mild diastolic dysfunction.  Patient subsequently underwent coronary artery bypass and graft with a LIMA to the LAD and saphenous vein graft to the second diagonal.  Since last seen patient has some residual chest soreness but no other chest pain.  No dyspnea or syncope.  The patient does not have symptoms concerning for COVID-19 infection (fever, chills, cough, or new shortness of breath).    Past Medical History:  Diagnosis Date  . Arthritis   . Carpal tunnel syndrome of left wrist   . Coronary artery disease   . GERD (gastroesophageal reflux disease)   . Glaucoma, both eyes   . Hx  of colonic polyps   . Hypertension   . Nocturia   . Prostate cancer [facility]) urologist-  dr borden/  oncologist-- dr Tammi Klippel   dx 08/ 2018 Gleason 3+3, PSA 6.8 active surveillane/  dx 09-15-2017 Stage T2a, Gleason 3+4, PSA 3.74-- plan external beam radiation and ADT   Past Surgical History:  Procedure Laterality Date  . CARPAL TUNNEL RELEASE Right 06-30-2007   dr sypher   w/  A-1 pulley release right index and long fingers  . CATARACT EXTRACTION W/ INTRAOCULAR LENS  IMPLANT, BILATERAL  2012 approx.  . COLONOSCOPY W/ POLYPECTOMY    . CORONARY ARTERY BYPASS GRAFT N/A 09/01/2018   Procedure: CORONARY ARTERY BYPASS GRAFTING (CABG)  x two, using left internal mammary and right saphenous vein (endoscopic vein harvest).;  Surgeon: Gaye Pollack, MD;  Location: [facility] OR;  Service: Open Heart Surgery;  Laterality: N/A;  . GOLD SEED IMPLANT N/A 11/14/2017   Procedure: GOLD SEED IMPLANT;  Surgeon: Raynelle Bring, MD;  [facility];  Service: Urology;  Laterality: N/A;  Needs Ultrasound Tech  . LAPAROSCOPY CECECTOMY W/ APPENDECTOMY  05-20-2009  dr Margot Chimes   cecal polyp at appendiceal base  . LEFT HEART CATH AND CORONARY ANGIOGRAPHY N/A 08/30/2018   Procedure: LEFT HEART CATH AND CORONARY ANGIOGRAPHY;  Surgeon: Belva Crome, MD;  Location: [place] CV LAB;  Service: Cardiovascular;  Laterality: N/A;  . PROSTATE BIOPSY  09-15-2017;  08/ 2018  . SHOULDER ARTHROSCOPY DISTAL CLAVICLE EXCISION AND OPEN ROTATOR CUFF REPAIR Right  08-06-2008   dr sypher   w/ debridement, SCD, bursectomy, acrominoplasty and Biceps tenodesis  . SHOULDER ARTHROSCOPY WITH OPEN ROTATOR CUFF REPAIR Left 2018   "and tendon repair's"  . SPACE OAR INSTILLATION N/A 11/14/2017   Procedure: SPACE OAR INSTILLATION;  Surgeon: Raynelle Bring, MD;  Location: [facility]  Service: Urology;  Laterality: N/A;  . TEE WITHOUT CARDIOVERSION N/A 09/01/2018   Procedure: TRANSESOPHAGEAL ECHOCARDIOGRAM (TEE);  Surgeon:  Gaye Pollack, MD;  Location: [place];  Service: Open Heart Surgery;  Laterality: N/A;     Current Meds  Medication Sig  . amLODipine (NORVASC) 5 MG tablet Take 5 mg by mouth daily.   Marland Kitchen aspirin EC 325 MG EC tablet Take 1 tablet (325 mg total) by mouth daily.  . Coenzyme Q10 (COQ10) 100 MG CAPS Take 100 mg by mouth daily.  . dorzolamide-timolol (COSOPT) 22.3-6.8 MG/ML ophthalmic solution Place 1 drop into both eyes 2 (two) times daily.   Marland Kitchen latanoprost (XALATAN) 0.005 % ophthalmic solution Place 1 drop into both eyes at bedtime.   . metoprolol succinate (TOPROL XL) 25 MG 24 hr tablet Take 1 tablet (25 mg total) by mouth daily.  . Misc Natural Products (OSTEO BI-FLEX JOINT SHIELD) TABS Take 1 tablet by mouth daily.   . rosuvastatin (CRESTOR) 40 MG tablet Take 1 tablet (40 mg total) by mouth at bedtime.  . tamsulosin (FLOMAX) 0.4 MG CAPS capsule TAKE 1 CAPSULE BY MOUTH ONCE DAILY AFTER SUPPER (Patient taking differently: Take 0.4 mg by mouth as needed. )     Allergies:   Tetanus toxoids and Levaquin [levofloxacin]   Social History   Tobacco Use  . Smoking status: Former Smoker    Years: 25.00    Types: Cigarettes    Last attempt to quit: 11/11/1986    Years since quitting: 31.9  . Smokeless tobacco: Former Systems developer    Types: Chew    Quit date: 11/10/1988  Substance Use Topics  . Alcohol use: Yes    Alcohol/week: 3.0 standard drinks    Types: 3 Cans of beer per week    Frequency: Never    Comment: 4-5 drinks per week  . Drug use: Never     Family Hx: The patient's family history includes Breast cancer in his daughter, sister, and sister; Hodgkin's lymphoma in his daughter; Melanoma in his daughter; Prostate cancer in his brother; Throat cancer in his father; Thyroid cancer in his sister.  ROS:   Please see the history of present illness.    No fevers, chills or productive cough. All other systems reviewed and are negative.  Recent Labs: 09/02/2018: Magnesium 2.1 09/04/2018:  Hemoglobin 10.9; Platelets 129 09/05/2018: BUN 10; Creatinine, Ser 1.01; Potassium 3.5; Sodium 135   Wt Readings from Last 3 Encounters:  10/27/18 188 lb (85.3 kg)  09/06/18 191 lb 12.8 oz (87 kg)  08/29/18 191 lb (86.6 kg)     Objective:    Vital Signs:  BP 118/66   Pulse 61   Ht 5\' 10"  (1.778 m)   Wt 188 lb (85.3 kg)   BMI 26.98 kg/m    VITAL SIGNS:  reviewed  No acute distress  Answers questions appropriately Normal affect Remainder of physical examination not performed (telehealth visit; coronavirus pandemic)  ASSESSMENT & PLAN:    1. Coronary artery disease status post coronary artery bypass graft-patient doing well following recent surgery.  Plan to continue medical therapy with aspirin and statin. 2. hypertension-patient's blood pressure is controlled.  Continue  present medications and follow. 3. Hyperlipidemia-continue Crestor.  Check lipids and liver. 4. Abdominal bruit-schedule abdominal ultrasound to exclude aneurysm.  COVID-19 Education: The importance of social distancing was discussed today.  Time:   Today, I have spent 12 minutes with the patient with telehealth technology discussing the above problems.     Medication Adjustments/Labs and Tests Ordered: Current medicines are reviewed at length with the patient today.  Concerns regarding medicines are outlined above.   Tests Ordered: No orders of the defined types were placed in this encounter.   Medication Changes: No orders of the defined types were placed in this encounter.   Disposition:  Follow up in 6 month(s)  Signed, Kirk Ruths, MD  10/27/2018 10:36 AM    [facility]

## 2018-10-27 ENCOUNTER — Telehealth (INDEPENDENT_AMBULATORY_CARE_PROVIDER_SITE_OTHER): Payer: Medicare Other | Admitting: Cardiology

## 2018-10-27 ENCOUNTER — Encounter: Payer: Self-pay | Admitting: Cardiology

## 2018-10-27 VITALS — BP 118/66 | HR 61 | Ht 70.0 in | Wt 188.0 lb

## 2018-10-27 DIAGNOSIS — I10 Essential (primary) hypertension: Secondary | ICD-10-CM

## 2018-10-27 DIAGNOSIS — E7800 Pure hypercholesterolemia, unspecified: Secondary | ICD-10-CM

## 2018-10-27 DIAGNOSIS — I2510 Atherosclerotic heart disease of native coronary artery without angina pectoris: Secondary | ICD-10-CM

## 2018-10-27 DIAGNOSIS — R0989 Other specified symptoms and signs involving the circulatory and respiratory systems: Secondary | ICD-10-CM

## 2018-10-27 NOTE — Addendum Note (Signed)
Addended by: Cristopher Estimable on: 10/27/2018 10:57 AM   Modules accepted: Orders

## 2018-10-27 NOTE — Patient Instructions (Signed)
Medication Instructions:  NO CHANGE If you need a refill on your cardiac medications before your next appointment, please call your pharmacy.   Lab work: Your physician recommends that you return for lab work in: 2-4 [place] If you have labs (blood work) drawn today and your tests are completely normal, you will receive your results only by: Marland Kitchen MyChart Message (if you have MyChart) OR . A paper copy in the mail If you have any lab test that is abnormal or we need to change your treatment, we will call you to review the results.  Testing/Procedures: Your physician has requested that you have an abdominal aorta duplex. During this test, an ultrasound is used to evaluate the aorta. Allow 30 minutes for this exam. Do not eat after midnight the day before and avoid carbonated beverages  [facility]  Follow-Up: At [facility], you and your health needs are our priority.  As part of our continuing mission to provide you with exceptional heart care, we have created designated Provider Care Teams.  These Care Teams include your primary Cardiologist (physician) and Advanced Practice Providers (APPs -  Physician Assistants and Nurse Practitioners) who all work together to provide you with the care you need, when you need it. You will need a follow up appointment in 6 months.  Please call our office 2 months in advance to schedule this appointment.  You may see Kirk Ruths, MD or one of the following Advanced Practice Providers on your designated Care Team:   Kerin Ransom, PA-C Roby Lofts, [place] . Sande Rives, PA-C

## 2018-11-21 ENCOUNTER — Other Ambulatory Visit: Payer: Self-pay

## 2018-11-21 ENCOUNTER — Ambulatory Visit (HOSPITAL_COMMUNITY)
Admission: RE | Admit: 2018-11-21 | Discharge: 2018-11-21 | Disposition: A | Discharge status: Home / Self Care | Payer: Medicare Other | Source: Ambulatory Visit | Attending: Internal Medicine | Admitting: Internal Medicine

## 2018-11-21 DIAGNOSIS — R0989 Other specified symptoms and signs involving the circulatory and respiratory systems: Secondary | ICD-10-CM | POA: Insufficient documentation

## 2018-11-21 LAB — LIPID PANEL
Chol/HDL Ratio: 2.5 ratio (ref 0.0–5.0)
Cholesterol, Total: 103 mg/dL (ref 100–199)
HDL: 41 mg/dL (ref >39)
LDL Calculated: 49 mg/dL (ref 0–99)
Triglycerides: 64 mg/dL (ref 0–149)
VLDL Cholesterol Cal: 13 mg/dL (ref 5–40)

## 2018-11-21 LAB — HEPATIC FUNCTION PANEL
ALT: 28 IU/L (ref 0–44)
AST: 34 IU/L (ref 0–40)
Albumin: 4.5 g/dL (ref 3.7–4.7)
Alkaline Phosphatase: 82 IU/L (ref 39–117)
Bilirubin Total: 0.5 mg/dL (ref 0.0–1.2)
Bilirubin, Direct: 0.17 mg/dL (ref 0.00–0.40)
Total Protein: 6.8 g/dL (ref 6.0–8.5)

## 2018-11-22 ENCOUNTER — Encounter: Payer: Self-pay | Admitting: *Deleted

## 2018-12-12 NOTE — Telephone Encounter (Signed)
Opened in error

## 2019-03-06 ENCOUNTER — Ambulatory Visit: Payer: Medicare Other | Admitting: Podiatry

## 2019-03-06 ENCOUNTER — Other Ambulatory Visit: Payer: Self-pay | Admitting: Podiatry

## 2019-03-06 ENCOUNTER — Ambulatory Visit (INDEPENDENT_AMBULATORY_CARE_PROVIDER_SITE_OTHER): Payer: Medicare Other

## 2019-03-06 ENCOUNTER — Other Ambulatory Visit: Payer: Self-pay

## 2019-03-06 VITALS — Temp 98.8°F

## 2019-03-06 DIAGNOSIS — B351 Tinea unguium: Secondary | ICD-10-CM

## 2019-03-06 DIAGNOSIS — M79672 Pain in left foot: Secondary | ICD-10-CM

## 2019-03-06 DIAGNOSIS — M7732 Calcaneal spur, left foot: Secondary | ICD-10-CM | POA: Diagnosis not present

## 2019-03-06 DIAGNOSIS — M7731 Calcaneal spur, right foot: Secondary | ICD-10-CM | POA: Diagnosis not present

## 2019-03-06 DIAGNOSIS — L603 Nail dystrophy: Secondary | ICD-10-CM | POA: Diagnosis not present

## 2019-03-06 DIAGNOSIS — M79671 Pain in right foot: Secondary | ICD-10-CM | POA: Diagnosis not present

## 2019-03-06 DIAGNOSIS — M79676 Pain in unspecified toe(s): Secondary | ICD-10-CM

## 2019-03-06 DIAGNOSIS — L600 Ingrowing nail: Secondary | ICD-10-CM

## 2019-03-06 MED ORDER — TERBINAFINE HCL 250 MG PO TABS: 250.0000 mg | ORAL_TABLET | Freq: Every day | ORAL | Status: DC

## 2019-03-06 NOTE — Patient Instructions (Signed)

## 2019-03-06 NOTE — Progress Notes (Signed)
Subjective:  Patient ID: Larry Roberts, male    DOB: 10-Mar-1941,  MRN: [id number]  Chief Complaint  Patient presents with  . Nail Problem    Right 1st toenail lateral border ingrown, 10 month duration. Pt denies drainage. Pt states he took antibiotics 3 months ago which helped but did not resolve the issue. Pt states the nail is painful.     78 y.o. male presents with the above complaint. Above compaints reviewed. Patient had secondary complaints of onychomycosis of the right great toenail. He doesn't recall the medication that he applies to it but doesn't help.    Review of Systems: Negative except as noted in the HPI. Denies N/V/F/Ch.  Past Medical History:  Diagnosis Date  . Arthritis   . Carpal tunnel syndrome of left wrist   . Coronary artery disease   . GERD (gastroesophageal reflux disease)   . Glaucoma, both eyes   . Hx of colonic polyps   . Hypertension   . Nocturia   . Prostate cancer [facility]) urologist-  dr borden/  oncologist-- dr Tammi Klippel   dx 08/ 2018 Gleason 3+3, PSA 6.8 active surveillane/  dx 09-15-2017 Stage T2a, Gleason 3+4, PSA 3.74-- plan external beam radiation and ADT    Current Outpatient Medications:  .  acetaminophen (TYLENOL) 500 MG tablet, Take 500 mg by mouth every 6 (six) hours as needed (pain/headaches.)., Disp: , Rfl:  .  ALPRAZolam (XANAX) 0.5 MG tablet, TAKE 1 2 TO 1 (ONE HALF TO ONE) TABLET BY MOUTH TWICE DAILY AS NEEDED, Disp: , Rfl:  .  amLODipine (NORVASC) 5 MG tablet, Take 5 mg by mouth daily. , Disp: , Rfl: 0 .  aspirin EC 325 MG EC tablet, Take 1 tablet (325 mg total) by mouth daily., Disp: , Rfl:  .  carboxymethylcellulose (REFRESH PLUS) 0.5 % SOLN, Place 1 drop into both eyes 3 (three) times daily as needed (dry/irritated eyes.)., Disp: , Rfl:  .  cephALEXin (KEFLEX) 500 MG capsule, TAKE 1 CAPSULE BY MOUTH TWICE DAILY FOR 7 DAYS, Disp: , Rfl:  .  Coenzyme Q10 (COQ10) 100 MG CAPS, Take 100 mg by mouth daily., Disp: , Rfl:  .   dorzolamide-timolol (COSOPT) 22.3-6.8 MG/ML ophthalmic solution, Place 1 drop into both eyes 2 (two) times daily. , Disp: , Rfl: 6 .  latanoprost (XALATAN) 0.005 % ophthalmic solution, Place 1 drop into both eyes at bedtime. , Disp: , Rfl: 2 .  metoprolol succinate (TOPROL XL) 25 MG 24 hr tablet, Take 1 tablet (25 mg total) by mouth daily., Disp: 90 tablet, Rfl: 3 .  Misc Natural Products (OSTEO BI-FLEX JOINT SHIELD) TABS, Take 1 tablet by mouth daily. , Disp: , Rfl:  .  ranitidine (ZANTAC) 150 MG tablet, Take 150 mg by mouth every other day. , Disp: , Rfl:  .  rosuvastatin (CRESTOR) 40 MG tablet, Take 1 tablet (40 mg total) by mouth at bedtime., Disp: 90 tablet, Rfl: 3 .  tamsulosin (FLOMAX) 0.4 MG CAPS capsule, TAKE 1 CAPSULE BY MOUTH ONCE DAILY AFTER SUPPER (Patient taking differently: Take 0.4 mg by mouth as needed. ), Disp: 30 capsule, Rfl: 0  Social History   Tobacco Use  Smoking Status Former Smoker  . Years: 25.00  . Types: Cigarettes  . Quit date: 11/11/1986  . Years since quitting: 32.3  Smokeless Tobacco Former Systems developer  . Types: Chew  . Quit date: 11/10/1988    Allergies  Allergen Reactions  . Tetanus Toxoids Other (See Comments)  SYNCOPE "black out"  . Levaquin [Levofloxacin] Itching   Objective:   Vitals:   03/06/19 1404  Temp: 98.8 F (37.1 C)   There is no height or weight on file to calculate BMI. Constitutional Well developed. Well nourished.  Vascular Dorsalis pedis pulses palpable bilaterally. Posterior tibial pulses palpable bilaterally. Capillary refill normal to all digits.  No cyanosis or clubbing noted. Pedal hair growth normal.  Neurologic Normal speech. Oriented to person, place, and time. Epicritic sensation to light touch grossly present bilaterally.  Dermatologic Painful ingrowing nail at lateral nail borders of the hallux nail right. No other open wounds. No skin lesions. Dystrophic/discolored/thickened/elongated nail noted of the right  hallux nail.   Orthopedic: Normal joint ROM without pain or crepitus bilaterally. No visible deformities. No bony tenderness.   Radiographs: None Assessment:   1. Pain in right foot   2. Pain in left foot   3. Heel pain, bilateral   4. Ingrown nail of great toe of right foot   5. Onychomycosis due to dermatophyte   6. Dystrophic nail    Plan:  Patient was evaluated and treated and all questions answered.  Treatment options of was dicussed for onychomycosis/dystropic nail including management with topical, oral, or laser therapy. . Nail biopsy was sent to [facility] lab for fungal culture. Patient was given script for Lamisil. The liver function test from 3 months ago was normal.   Ingrown Nail, right -Patient elects to proceed with minor surgery to remove ingrown toenail removal today. Consent reviewed and signed by patient. -Ingrown nail excised. See procedure note. -Educated on post-procedure care including soaking. Written instructions provided and reviewed. -Patient to follow up in 2 weeks for nail check. -Nail was sent to [facility] Lab for fungal culture  Procedure: Excision of Ingrown Toenail Location: Right 1st toe lateral nail borders. Anesthesia: Lidocaine 1% plain; 1.5 mL and Marcaine 0.5% plain; 1.5 mL, digital block. Skin Prep: Betadine. Dressing: Silvadene; telfa; dry, sterile, compression dressing. Technique: Following skin prep, the toe was exsanguinated and a tourniquet was secured at the base of the toe. The affected nail border was freed, split with a nail splitter, and excised. Chemical matrixectomy was then performed with phenol and irrigated out with alcohol. The tourniquet was then removed and sterile dressing applied. Disposition: Patient tolerated procedure well. Patient to return in 2 weeks for follow-up.   No follow-ups on file.

## 2019-03-13 ENCOUNTER — Ambulatory Visit: Payer: Medicare Other | Admitting: Podiatry

## 2019-03-21 ENCOUNTER — Encounter: Payer: Self-pay | Admitting: Podiatry

## 2019-03-21 ENCOUNTER — Other Ambulatory Visit: Payer: Self-pay

## 2019-03-21 ENCOUNTER — Ambulatory Visit: Payer: Self-pay | Admitting: Podiatry

## 2019-03-21 DIAGNOSIS — B351 Tinea unguium: Secondary | ICD-10-CM | POA: Diagnosis not present

## 2019-03-21 DIAGNOSIS — L600 Ingrowing nail: Secondary | ICD-10-CM

## 2019-03-21 DIAGNOSIS — L603 Nail dystrophy: Secondary | ICD-10-CM | POA: Diagnosis not present

## 2019-03-21 DIAGNOSIS — M79676 Pain in unspecified toe(s): Secondary | ICD-10-CM

## 2019-03-21 NOTE — Progress Notes (Signed)
Subjective: Larry Roberts is a 78 y.o.  male returns to office today for follow up evaluation after having right Hallux lateral nail avulsion performed. Patient has been soaking using epsom and applying topical antibiotic covered with bandaid daily. Patient denies fevers, chills, nausea, vomiting. Denies any calf pain, chest pain, SOB.   Objective:  Vitals: Reviewed  General: Well developed, nourished, in no acute distress, alert and oriented x3   Dermatology: Skin is warm, dry and supple bilateral. Lateral hallux nail border appears to be clean, dry, with mild granular tissue and surrounding scab. There is no surrounding erythema, edema, drainage/purulence. The remaining nails appear unremarkable at this time. There are no other lesions or other signs of infection present.  Neurovascular status: Intact. No lower extremity swelling; No pain with calf compression bilateral.  Musculoskeletal: Decreased tenderness to palpation of the lateral hallux nail fold(s). Muscular strength within normal limits bilateral.   Assesement and Plan: S/p partial nail avulsion, doing well.   -Continue soaking in epsom salts twice a day followed by antibiotic ointment and a band-aid. Can leave uncovered at night. Continue this until completely healed.  -If the area has not healed in 2 weeks, call the office for follow-up appointment, or sooner if any problems arise.  -Monitor for any signs/symptoms of infection. Call the office immediately if any occur or go directly to the emergency room. Call with any questions/concerns. -He also picked up his refurbish orthotic Rick.   Boneta Lucks, DPM

## 2019-05-16 NOTE — Progress Notes (Signed)
HPI: FU CAD.  Nuclear study March 2020 significantly abnormal.  Cardiac catheterization March 2020 showed a 99% proximal LAD, 90% ostial first diagonal, 70% apical LAD, 60% circumflex and 60% right coronary artery.  Carotid Dopplers March 2020 showed 1 to 39% bilateral stenosis. Echo March 2020 showed normal LV function, mild diastolic dysfunction.  Patient subsequently underwent coronary artery bypass and graft with a LIMA to the LAD and saphenous vein graft to the second diagonal. Abdominal ultrasound June 2020 showed no aneurysm.  Since last seen the patient denies any dyspnea on exertion, orthopnea, PND, pedal edema, palpitations, syncope or chest pain.   Current Outpatient Medications  Medication Sig Dispense Refill  . acetaminophen (TYLENOL) 500 MG tablet Take 500 mg by mouth every 6 (six) hours as needed (pain/headaches.).    Marland Kitchen ALPRAZolam (XANAX) 0.5 MG tablet TAKE 1 2 TO 1 (ONE HALF TO ONE) TABLET BY MOUTH TWICE DAILY AS NEEDED    . amLODipine (NORVASC) 5 MG tablet Take 5 mg by mouth daily.   0  . aspirin EC 81 MG tablet Take 81 mg by mouth daily.    . carboxymethylcellulose (REFRESH PLUS) 0.5 % SOLN Place 1 drop into both eyes 3 (three) times daily as needed (dry/irritated eyes.).    Marland Kitchen Coenzyme Q10 (COQ10) 100 MG CAPS Take 100 mg by mouth daily.    . dorzolamide-timolol (COSOPT) 22.3-6.8 MG/ML ophthalmic solution Place 1 drop into both eyes 2 (two) times daily.   6  . latanoprost (XALATAN) 0.005 % ophthalmic solution Place 1 drop into both eyes at bedtime.   2  . metoprolol succinate (TOPROL XL) 25 MG 24 hr tablet Take 1 tablet (25 mg total) by mouth daily. 90 tablet 3  . Misc Natural Products (OSTEO BI-FLEX JOINT SHIELD) TABS Take 1 tablet by mouth daily.     . rosuvastatin (CRESTOR) 40 MG tablet Take 1 tablet (40 mg total) by mouth at bedtime. 90 tablet 3  . tamsulosin (FLOMAX) 0.4 MG CAPS capsule TAKE 1 CAPSULE BY MOUTH ONCE DAILY AFTER SUPPER (Patient taking differently: Take  0.4 mg by mouth as needed. ) 30 capsule 0   Current Facility-Administered Medications  Medication Dose Route Frequency Provider Last Rate Last Admin  . terbinafine (LAMISIL) tablet 250 mg  250 mg Oral Daily Felipa Furnace, DPM         Past Medical History:  Diagnosis Date  . Arthritis   . Carpal tunnel syndrome of left wrist   . Coronary artery disease   . GERD (gastroesophageal reflux disease)   . Glaucoma, both eyes   . Hx of colonic polyps   . Hypertension   . Nocturia   . Prostate cancer [facility]) urologist-  dr borden/  oncologist-- dr Tammi Klippel   dx 08/ 2018 Gleason 3+3, PSA 6.8 active surveillane/  dx 09-15-2017 Stage T2a, Gleason 3+4, PSA 3.74-- plan external beam radiation and ADT    Past Surgical History:  Procedure Laterality Date  . CARPAL TUNNEL RELEASE Right 06-30-2007   dr sypher   w/  A-1 pulley release right index and long fingers  . CATARACT EXTRACTION W/ INTRAOCULAR LENS  IMPLANT, BILATERAL  2012 approx.  . COLONOSCOPY W/ POLYPECTOMY    . CORONARY ARTERY BYPASS GRAFT N/A 09/01/2018   Procedure: CORONARY ARTERY BYPASS GRAFTING (CABG)  x two, using left internal mammary and right saphenous vein (endoscopic vein harvest).;  Surgeon: Gaye Pollack, MD;  [facility];  Service: Open Heart Surgery;  Laterality: N/A;  . GOLD SEED IMPLANT N/A 11/14/2017   Procedure: GOLD SEED IMPLANT;  Surgeon: Raynelle Bring, MD;  Location: [facility];  Service: Urology;  Laterality: N/A;  Needs Ultrasound Tech  . LAPAROSCOPY CECECTOMY W/ APPENDECTOMY  05-20-2009  dr Margot Chimes   cecal polyp at appendiceal base  . LEFT HEART CATH AND CORONARY ANGIOGRAPHY N/A 08/30/2018   Procedure: LEFT HEART CATH AND CORONARY ANGIOGRAPHY;  Surgeon: Belva Crome, MD;  Location: [place] CV LAB;  Service: Cardiovascular;  Laterality: N/A;  . PROSTATE BIOPSY  09-15-2017;  08/ 2018  . SHOULDER ARTHROSCOPY DISTAL CLAVICLE EXCISION AND OPEN ROTATOR CUFF REPAIR Right 08-06-2008   dr sypher   w/  debridement, SCD, bursectomy, acrominoplasty and Biceps tenodesis  . SHOULDER ARTHROSCOPY WITH OPEN ROTATOR CUFF REPAIR Left 2018   "and tendon repair's"  . SPACE OAR INSTILLATION N/A 11/14/2017   Procedure: SPACE OAR INSTILLATION;  Surgeon: Raynelle Bring, MD;  Location: [facility];  Service: Urology;  Laterality: N/A;  . TEE WITHOUT CARDIOVERSION N/A 09/01/2018   Procedure: TRANSESOPHAGEAL ECHOCARDIOGRAM (TEE);  Surgeon: Gaye Pollack, MD;  Location: [place];  Service: Open Heart Surgery;  Laterality: N/A;    Social History   Socioeconomic History  . Marital status: Married    Spouse name: Bethena Roys  . Number of children: 2  . Years of education: Not on file  . Highest education level: Not on file  Occupational History  . Occupation: retired  Tobacco Use  . Smoking status: Former Smoker    Years: 25.00    Types: Cigarettes    Quit date: 11/11/1986    Years since quitting: 32.5  . Smokeless tobacco: Former Systems developer    Types: [place] date: 11/10/1988  Substance and Sexual Activity  . Alcohol use: Yes    Alcohol/week: 3.0 standard drinks    Types: 3 Cans of beer per week    Comment: 4-5 drinks per week  . Drug use: Never  . Sexual activity: Yes  Other Topics Concern  . Not on file  Social History Narrative  . Not on file   Social Determinants of Health   Financial Resource Strain:   . Difficulty of Paying Living Expenses: Not on file  Food Insecurity:   . Worried About Charity fundraiser in the Last Year: Not on file  . Ran Out of Food in the Last Year: Not on file  Transportation Needs:   . Lack of Transportation (Medical): Not on file  . Lack of Transportation (Non-Medical): Not on file  Physical Activity:   . Days of Exercise per Week: Not on file  . Minutes of Exercise per Session: Not on file  Stress:   . Feeling of Stress : Not on file  Social Connections:   . Frequency of Communication with Friends and Family: Not on file  . Frequency of Social  Gatherings with Friends and Family: Not on file  . Attends Religious Services: Not on file  . Active Member of Clubs or Organizations: Not on file  . Attends Archivist Meetings: Not on file  . Marital Status: Not on file  Intimate Partner Violence:   . Fear of Current or Ex-Partner: Not on file  . Emotionally Abused: Not on file  . Physically Abused: Not on file  . Sexually Abused: Not on file    Family History  Problem Relation Age of Onset  . Prostate cancer Brother   .  Hodgkin's lymphoma Daughter   . Breast cancer Daughter   . Melanoma Daughter   . Throat cancer Father   . Breast cancer Sister   . Breast cancer Sister   . Thyroid cancer Sister     ROS: no fevers or chills, productive cough, hemoptysis, dysphasia, odynophagia, melena, hematochezia, dysuria, hematuria, rash, seizure activity, orthopnea, PND, pedal edema, claudication. Remaining systems are negative.  Physical Exam: Well-developed well-nourished in no acute distress.  Skin is warm and dry.  HEENT is normal.  Neck is supple.  Chest is clear to auscultation with normal expansion.  Cardiovascular exam is regular rate and rhythm.  Abdominal exam nontender or distended. No masses palpated. Extremities show no edema. neuro grossly intact  ECG-sinus bradycardia at a rate of 55, left anterior fascicular block, RV conduction delay.  Personally reviewed  A/P  1 coronary artery disease status post coronary artery bypass graft-patient denies recurrent chest pain.  Continue aspirin and statin.  2 hypertension-blood pressure controlled.  Continue present medications.  3 hyperlipidemia-continue statin.  Kirk Ruths, MD

## 2019-05-25 ENCOUNTER — Encounter (INDEPENDENT_AMBULATORY_CARE_PROVIDER_SITE_OTHER): Payer: Self-pay

## 2019-05-25 ENCOUNTER — Encounter: Payer: Self-pay | Admitting: Cardiology

## 2019-05-25 ENCOUNTER — Other Ambulatory Visit: Payer: Self-pay

## 2019-05-25 ENCOUNTER — Ambulatory Visit: Payer: Medicare Other | Admitting: Cardiology

## 2019-05-25 VITALS — BP 140/60 | HR 55 | Ht 70.0 in | Wt 198.0 lb

## 2019-05-25 DIAGNOSIS — E7800 Pure hypercholesterolemia, unspecified: Secondary | ICD-10-CM | POA: Diagnosis not present

## 2019-05-25 DIAGNOSIS — I2510 Atherosclerotic heart disease of native coronary artery without angina pectoris: Secondary | ICD-10-CM | POA: Diagnosis not present

## 2019-05-25 DIAGNOSIS — I10 Essential (primary) hypertension: Secondary | ICD-10-CM

## 2019-05-25 NOTE — Patient Instructions (Signed)
Medication Instructions:  NO CHANGE *If you need a refill on your cardiac medications before your next appointment, please call your pharmacy*  Lab Work: If you have labs (blood work) drawn today and your tests are completely normal, you will receive your results only by: . MyChart Message (if you have MyChart) OR . A paper copy in the mail If you have any lab test that is abnormal or we need to change your treatment, we will call you to review the results.  Follow-Up: At [facility], you and your health needs are our priority.  As part of our continuing mission to provide you with exceptional heart care, we have created designated Provider Care Teams.  These Care Teams include your primary Cardiologist (physician) and Advanced Practice Providers (APPs -  Physician Assistants and Nurse Practitioners) who all work together to provide you with the care you need, when you need it.  Your next appointment:   12 month(s)  The format for your next appointment:   Either In Person or Virtual  Provider:   You may see Brian Crenshaw, MD or one of the following Advanced Practice Providers on your designated Care Team:    Luke Kilroy, PA-C  Callie Goodrich, PA-C  Jesse Cleaver, FNP    

## 2019-07-06 ENCOUNTER — Other Ambulatory Visit: Payer: Self-pay | Admitting: Cardiology

## 2019-07-06 DIAGNOSIS — E7800 Pure hypercholesterolemia, unspecified: Secondary | ICD-10-CM

## 2019-07-30 DIAGNOSIS — M79676 Pain in unspecified toe(s): Secondary | ICD-10-CM

## 2019-08-08 ENCOUNTER — Ambulatory Visit
Admission: RE | Admit: 2019-08-08 | Discharge: 2019-08-08 | Disposition: A | Discharge status: Home / Self Care | Payer: Medicare Other | Source: Ambulatory Visit | Attending: Internal Medicine | Admitting: Internal Medicine

## 2019-08-08 ENCOUNTER — Other Ambulatory Visit: Payer: Self-pay | Admitting: Internal Medicine

## 2019-08-08 ENCOUNTER — Other Ambulatory Visit: Payer: Self-pay

## 2019-08-08 DIAGNOSIS — M5440 Lumbago with sciatica, unspecified side: Secondary | ICD-10-CM

## 2019-08-20 ENCOUNTER — Other Ambulatory Visit: Payer: Self-pay | Admitting: Cardiology

## 2019-08-20 DIAGNOSIS — R9439 Abnormal result of other cardiovascular function study: Secondary | ICD-10-CM

## 2019-10-16 ENCOUNTER — Telehealth: Payer: Self-pay | Admitting: Cardiology

## 2019-10-16 NOTE — Telephone Encounter (Signed)
   [facility] Pre-operative Risk Assessment    Request for surgical clearance:  1. What type of surgery is being performed? Spinal Injection   2. When is this surgery scheduled? TBD   3. What type of clearance is required (medical clearance vs. Pharmacy clearance to hold med vs. Both)? Pharmacy   4. Are there any medications that need to be held prior to surgery and how long?Aspirin for 7 days   5. Practice name and name of physician performing surgery? [facility]; Dr. Brien Few   6. What is your office phone number [phone]    7.   What is your office fax number [phone]  8.   Anesthesia type (None, local, MAC, general) ? Not specified   Larry Roberts 10/16/2019, 10:45 AM  _________________________________________________________________   (provider comments below)

## 2019-10-16 NOTE — Telephone Encounter (Signed)
   Primary Cardiologist: Kirk Ruths, MD  Chart reviewed as part of pre-operative protocol coverage. Given past medical history and time since last visit, based on ACC/AHA guidelines, Dunbar J Eichinger would be at acceptable risk for the planned procedure without further cardiovascular testing. He may hold ASA for 7 days prior to the procedure. Begin again ASAP thereafter.   I will route this recommendation to the requesting party via Epic fax function and remove from pre-op pool.  Please call with questions.  Phill Myron. Riccardo Holeman DNP, ANP, AACC  10/16/2019, 11:05 AM

## 2019-11-14 ENCOUNTER — Other Ambulatory Visit: Payer: Self-pay | Admitting: Cardiology

## 2019-11-14 DIAGNOSIS — R9439 Abnormal result of other cardiovascular function study: Secondary | ICD-10-CM

## 2019-11-23 ENCOUNTER — Telehealth: Payer: Self-pay

## 2019-11-23 NOTE — Telephone Encounter (Signed)
   [facility] Pre-operative Risk Assessment    HEARTCARE STAFF: - Please ensure there is not already an duplicate clearance open for this procedure. - Under Visit Info/Reason for Call, type in Other and utilize the format Clearance MM/DD/YY or Clearance TBD. Do not use dashes or single digits. - If request is for dental extraction, please clarify the # of teeth to be extracted.  Request for surgical clearance:  1. What type of surgery is being performed? L4-5 LAMINECTOMY FOR SYNOVIAL CYST   2. When is this surgery scheduled? TBD   3. What type of clearance is required (medical clearance vs. Pharmacy clearance to hold med vs. Both)? MEDICAL  4. Are there any medications that need to be held prior to surgery and how long? NONE   5. Practice name and name of physician performing surgery? [facility] Earnie Larsson, MD   6. What is the office phone number? [phone]   7.   What is the office fax number? [phone]  8.   Anesthesia type (None, local, MAC, general) ? GENERAL

## 2019-11-23 NOTE — Telephone Encounter (Signed)
° °  Primary Cardiologist: Kirk Ruths, MD  Chart reviewed as part of pre-operative protocol coverage. Given past medical history and time since last visit, based on ACC/AHA guidelines, Demarrio J Mccorkel would be at acceptable risk for the planned procedure without further cardiovascular testing.   I will route this recommendation to the requesting party via Epic fax function and remove from pre-op pool.  Please call with questions.  Jossie Ng. Alem Fahl NP-C    11/23/2019, [facility] [place] [address] Office [phone] Fax [phone]

## 2019-11-26 ENCOUNTER — Other Ambulatory Visit: Payer: Self-pay | Admitting: Neurosurgery

## 2019-11-27 NOTE — Progress Notes (Signed)
Your procedure is scheduled on Thursday, June 17th.  Report to [facility] Main Entrance "A" at 6:00 A.M., and check in at the Admitting office.  Call this number if you have problems the morning of surgery:  [phone]  Call [phone] if you have any questions prior to your surgery date Monday-Friday 8am-4pm   Remember:  Do not eat or drink after midnight the night before your surgery    Take these medicines the morning of surgery with A SIP OF WATER  acetaminophen (TYLENOL)  metoprolol succinate (TOPROL-XL)  If needed - ALPRAZolam Duanne Moron), dorzolamide-timolol (COSOPT)/eye drops, famotidine (PEPCID), tamsulosin (FLOMAX)    Follow your surgeon's instructions on when to stop Aspirin.  If no instructions were given by your surgeon then you will need to call the office to get those instructions.    As of today, STOP taking Aspirin containing products, Aleve, Naproxen, Ibuprofen, Motrin, Advil, Goody's, BC's, all herbal medications, fish oil, and all vitamins.                     Do not wear jewelry.            Do not wear lotions, powders, colognes, or deodorant.            Men may shave face and neck.            Do not bring valuables to the hospital.            [facility] is not responsible for any belongings or valuables.  Do NOT Smoke (Tobacco/Vapping) or drink Alcohol 24 hours prior to your procedure If you use a CPAP at night, you may bring all equipment for your overnight stay.   Contacts, glasses, dentures or bridgework may not be worn into surgery.      For patients admitted to the hospital, discharge time will be determined by your treatment team.   Patients discharged the day of surgery will not be allowed to drive home, and someone needs to stay with them for 24 hours.  Special instructions:   [place]- Preparing For Surgery  Before surgery, you can play an important role. Because skin is not sterile, your skin needs to be as free of germs as possible. You can  reduce the number of germs on your skin by washing with CHG (chlorahexidine gluconate) Soap before surgery.  CHG is an antiseptic cleaner which kills germs and bonds with the skin to continue killing germs even after washing.    Oral Hygiene is also important to reduce your risk of infection.  Remember - BRUSH YOUR TEETH THE MORNING OF SURGERY WITH YOUR REGULAR TOOTHPASTE  Please do not use if you have an allergy to CHG or antibacterial soaps. If your skin becomes reddened/irritated stop using the CHG.  Do not shave (including legs and underarms) for at least 48 hours prior to first CHG shower. It is OK to shave your face.  Please follow these instructions carefully.   1. Shower the NIGHT BEFORE SURGERY and the MORNING OF SURGERY with CHG Soap.   2. If you chose to wash your hair, wash your hair first as usual with your normal shampoo.  3. After you shampoo, rinse your hair and body thoroughly to remove the shampoo.  4. Use CHG as you would any other liquid soap. You can apply CHG directly to the skin and wash gently with a scrungie or a clean washcloth.   5. Apply the CHG Soap to your  body ONLY FROM THE NECK DOWN.  Do not use on open wounds or open sores. Avoid contact with your eyes, ears, mouth and genitals (private parts). Wash Face and genitals (private parts)  with your normal soap.   6. Wash thoroughly, paying special attention to the area where your surgery will be performed.  7. Thoroughly rinse your body with warm water from the neck down.  8. DO NOT shower/wash with your normal soap after using and rinsing off the CHG Soap.  9. Pat yourself dry with a CLEAN TOWEL.  10. Wear CLEAN PAJAMAS to bed the night before surgery, wear comfortable clothes the morning of surgery  11. Place CLEAN SHEETS on your bed the night of your first shower and DO NOT SLEEP WITH PETS.  Day of Surgery: Shower with CHG soap as instructed above.  Do not apply any deodorants/lotions.  Please wear  clean clothes to the hospital/surgery center.   Remember to brush your teeth WITH YOUR REGULAR TOOTHPASTE.   Please read over the following fact sheets that you were given.

## 2019-11-28 ENCOUNTER — Other Ambulatory Visit (HOSPITAL_COMMUNITY)
Admission: RE | Admit: 2019-11-28 | Discharge: 2019-11-28 | Disposition: A | Discharge status: Home / Self Care | Payer: Medicare Other | Source: Ambulatory Visit | Attending: Neurosurgery | Admitting: Neurosurgery

## 2019-11-28 ENCOUNTER — Encounter (HOSPITAL_COMMUNITY)
Admission: RE | Admit: 2019-11-28 | Discharge: 2019-11-28 | Disposition: A | Discharge status: Home / Self Care | Payer: Medicare Other | Source: Ambulatory Visit | Attending: Neurosurgery | Admitting: Neurosurgery

## 2019-11-28 ENCOUNTER — Encounter (HOSPITAL_COMMUNITY): Payer: Self-pay

## 2019-11-28 ENCOUNTER — Other Ambulatory Visit: Payer: Self-pay

## 2019-11-28 DIAGNOSIS — I10 Essential (primary) hypertension: Secondary | ICD-10-CM | POA: Insufficient documentation

## 2019-11-28 DIAGNOSIS — Z79899 Other long term (current) drug therapy: Secondary | ICD-10-CM | POA: Diagnosis not present

## 2019-11-28 DIAGNOSIS — M7130 Other bursal cyst, unspecified site: Secondary | ICD-10-CM | POA: Diagnosis not present

## 2019-11-28 DIAGNOSIS — Z7982 Long term (current) use of aspirin: Secondary | ICD-10-CM | POA: Diagnosis not present

## 2019-11-28 DIAGNOSIS — I2510 Atherosclerotic heart disease of native coronary artery without angina pectoris: Secondary | ICD-10-CM | POA: Diagnosis not present

## 2019-11-28 DIAGNOSIS — Z20822 Contact with and (suspected) exposure to covid-19: Secondary | ICD-10-CM | POA: Insufficient documentation

## 2019-11-28 DIAGNOSIS — K219 Gastro-esophageal reflux disease without esophagitis: Secondary | ICD-10-CM | POA: Insufficient documentation

## 2019-11-28 DIAGNOSIS — Z951 Presence of aortocoronary bypass graft: Secondary | ICD-10-CM | POA: Diagnosis not present

## 2019-11-28 DIAGNOSIS — Z8546 Personal history of malignant neoplasm of prostate: Secondary | ICD-10-CM | POA: Diagnosis not present

## 2019-11-28 DIAGNOSIS — Z87891 Personal history of nicotine dependence: Secondary | ICD-10-CM | POA: Diagnosis not present

## 2019-11-28 DIAGNOSIS — H409 Unspecified glaucoma: Secondary | ICD-10-CM | POA: Diagnosis not present

## 2019-11-28 DIAGNOSIS — Z01818 Encounter for other preprocedural examination: Secondary | ICD-10-CM | POA: Insufficient documentation

## 2019-11-28 DIAGNOSIS — F419 Anxiety disorder, unspecified: Secondary | ICD-10-CM | POA: Diagnosis not present

## 2019-11-28 HISTORY — DX: Anxiety disorder, unspecified: F41.9

## 2019-11-28 LAB — BASIC METABOLIC PANEL
Anion gap: 9 (ref 5–15)
BUN: 12 mg/dL (ref 8–23)
CO2: 23 mmol/L (ref 22–32)
Calcium: 9.8 mg/dL (ref 8.9–10.3)
Chloride: 111 mmol/L (ref 98–111)
Creatinine, Ser: 1.06 mg/dL (ref 0.61–1.24)
GFR calc Af Amer: >60 mL/min (ref >60)
GFR calc non Af Amer: >60 mL/min (ref >60)
Glucose, Bld: 97 mg/dL (ref 70–99)
Potassium: 4 mmol/L (ref 3.5–5.1)
Sodium: 143 mmol/L (ref 135–145)

## 2019-11-28 LAB — CBC WITH DIFFERENTIAL/PLATELET
Abs Immature Granulocytes: 0.01 10*3/uL (ref 0.00–0.07)
Basophils Absolute: 0.1 10*3/uL (ref 0.0–0.1)
Basophils Relative: 1 %
Eosinophils Absolute: 0.1 10*3/uL (ref 0.0–0.5)
Eosinophils Relative: 2 %
HCT: 44.3 % (ref 39.0–52.0)
Hemoglobin: 15.4 g/dL (ref 13.0–17.0)
Immature Granulocytes: 0 %
Lymphocytes Relative: 28 %
Lymphs Abs: 1.4 10*3/uL (ref 0.7–4.0)
MCH: 31.4 pg (ref 26.0–34.0)
MCHC: 34.8 g/dL (ref 30.0–36.0)
MCV: 90.2 fL (ref 80.0–100.0)
Monocytes Absolute: 0.6 10*3/uL (ref 0.1–1.0)
Monocytes Relative: 12 %
Neutro Abs: 2.8 10*3/uL (ref 1.7–7.7)
Neutrophils Relative %: 57 %
Platelets: 186 10*3/uL (ref 150–400)
RBC: 4.91 MIL/uL (ref 4.22–5.81)
RDW: 12.8 % (ref 11.5–15.5)
WBC: 5 10*3/uL (ref 4.0–10.5)
nRBC: 0 % (ref 0.0–0.2)

## 2019-11-28 LAB — SARS CORONAVIRUS 2 (TAT 6-24 HRS): SARS Coronavirus 2: NEGATIVE

## 2019-11-28 LAB — SURGICAL PCR SCREEN
MRSA, PCR: NEGATIVE
Staphylococcus aureus: NEGATIVE

## 2019-11-28 NOTE — Progress Notes (Signed)
Anesthesia Chart Review:  Case: [id number] Date/Time: 11/29/19 0745   Procedure: Laminectomy for facet/synovial cyst - bilateral - L4-L5 (Bilateral Back) - 3C   Anesthesia type: General   Pre-op diagnosis: Synovial cyst   Location: [facility] OR ROOM 20 / [facility]   Surgeons: Earnie Larsson, MD      DISCUSSION: Patient is a 79 year old male scheduled for the above procedure.  History includes former smoker (quitq 11/11/06), CAD (s/p CABG: LIMA-LAD, SVG-D2 09/01/18), prostate cancer (s/p transperineal placement of fiducial markers into prostate 11/14/17), GERD, glaucoma, HTN, anxiety.   Preoperative cardiology input outlined on 11/23/2019 by Coletta Memos, NP, "Given past medical history and time since last visit, based on ACC/AHA guidelines, Larry Roberts would be at acceptable risk for the planned procedure without further cardiovascular testing." He had previously been permission by cardiology to hold aspirin for 7 days prior to spinal injection.  Last aspirin 11/26/2019. 11/28/2019 presurgical COVID-19 test in process. Anesthesia team to evaluate on the day of surgery.   VS: BP 109/74   Pulse 71   Temp 36.9 C (Oral)   Resp 18   Ht 5\' 10"  (1.778 m)   Wt 88.5 kg   SpO2 98%   BMI 28.01 kg/m    PROVIDERS: Wenda Low, MD his PCP Kirk Ruths, MD is cardiologist. Last visit 05/25/2019. Derrek Monaco, MD is urologist Tyler Pita, MD is RAD-ONC Gilford Raid, MD his CT surgeon   LABS: Labs reviewed: Acceptable for surgery. (all labs ordered are listed, but only abnormal results are displayed)  Labs Reviewed  SURGICAL PCR SCREEN  BASIC METABOLIC PANEL  CBC WITH DIFFERENTIAL/PLATELET    PFTs 08/31/18: FVC 4.16 (99%), FEV1 3.33 (110%), DLCO unc 20.22 (80%), cor 20.39 (81%).   EKG: 05/25/2019 ([facility]): Sinus bradycardia 55 bpm Incomplete right bundle branch block Left anterior fascicular block Cannot rule out anterior infarct, age undetermined Abnormal  ECG   CV: Abdominal Aorta US 11/21/18: Summary:  Abdominal Aorta: No evidence of an abdominal aortic aneurysm was  visualized. The largest aortic measurement is 1.8 cm. No previous exam  available for comparison.     TEE (intra-op CABG) 09/01/18: . Left ventricle: Cavity is small. Concentric hypertrophy of moderate  severity.  . Left atrium: Left atrial appendage filling and emptying velocities are  normal.  . Aortic valve: No stenosis.  . Mitral valve: Mild mitral annular calcification. Trace regurgitation.  The annulus is mildy.  . Right ventricle: Normal wall thickness and ejection fraction.  . Tricuspid valve: Mild regurgitation. The tricuspid valve regurgitation  jet is central.  . Pulmonic valve: Mild regurgitation.    Echo 08/31/18: IMPRESSIONS  1. The left ventricle has normal systolic function with an ejection  fraction of 60-65%. The cavity size was normal. There is mildly increased  left ventricular wall thickness. Left ventricular diastolic Doppler  parameters are consistent with impaired  relaxation. Indeterminate filling pressures The E/e' is 8-15. No evidence  of left ventricular regional wall motion abnormalities.  2. The right ventricle has normal systolic function. The cavity was  normal. There is no increase in right ventricular wall thickness.  3. The mitral valve is degenerative. Mild thickening of the mitral valve  leaflet.  4. The aortic valve is tricuspid.    Carotid US 08/31/18: Summary:  - Right Carotid: Velocities in the right ICA are consistent with a 1-39%  stenosis.  - Left Carotid: Velocities in the left ICA are consistent with a 1-39%  stenosis.  - Vertebrals: Bilateral vertebral  arteries demonstrate antegrade flow.  - Subclavians: Normal flow hemodynamics were seen in bilateral subclavian        arteries.    Last cardiac cath was 08/30/18 prior to CABG.   Past Medical History:  Diagnosis Date  . Anxiety   .  Arthritis   . Carpal tunnel syndrome of left wrist   . Coronary artery disease   . GERD (gastroesophageal reflux disease)   . Glaucoma, both eyes   . Hx of colonic polyps   . Hypertension   . Nocturia   . Prostate cancer [facility]) urologist-  dr borden/  oncologist-- dr Tammi Klippel   dx 08/ 2018 Gleason 3+3, PSA 6.8 active surveillane/  dx 09-15-2017 Stage T2a, Gleason 3+4, PSA 3.74-- plan external beam radiation and ADT    Past Surgical History:  Procedure Laterality Date  . CARPAL TUNNEL RELEASE Right 06-30-2007   dr sypher   w/  A-1 pulley release right index and long fingers  . CATARACT EXTRACTION W/ INTRAOCULAR LENS  IMPLANT, BILATERAL  2012 approx.  . COLONOSCOPY W/ POLYPECTOMY    . CORONARY ARTERY BYPASS GRAFT N/A 09/01/2018   Procedure: CORONARY ARTERY BYPASS GRAFTING (CABG)  x two, using left internal mammary and right saphenous vein (endoscopic vein harvest).;  Surgeon: Gaye Pollack, MD;  Location: [facility];  Service: Open Heart Surgery;  Laterality: N/A;  . GOLD SEED IMPLANT N/A 11/14/2017   Procedure: GOLD SEED IMPLANT;  Surgeon: Raynelle Bring, MD;  Location: [facility];  Service: Urology;  Laterality: N/A;  Needs Ultrasound Tech  . LAPAROSCOPY CECECTOMY W/ APPENDECTOMY  05-20-2009  dr Margot Chimes   cecal polyp at appendiceal base  . LEFT HEART CATH AND CORONARY ANGIOGRAPHY N/A 08/30/2018   Procedure: LEFT HEART CATH AND CORONARY ANGIOGRAPHY;  Surgeon: Belva Crome, MD;  Location: [place] [facility];  Service: Cardiovascular;  Laterality: N/A;  . PROSTATE BIOPSY  09-15-2017;  08/ 2018  . SHOULDER ARTHROSCOPY DISTAL CLAVICLE EXCISION AND OPEN ROTATOR CUFF REPAIR Right 08-06-2008   dr sypher   w/ debridement, SCD, bursectomy, acrominoplasty and Biceps tenodesis  . SHOULDER ARTHROSCOPY WITH OPEN ROTATOR CUFF REPAIR Left 2018   "and tendon repair's"  . SPACE OAR INSTILLATION N/A 11/14/2017   Procedure: SPACE OAR INSTILLATION;  Surgeon: Raynelle Bring, MD;  Location: [facility];  Service: Urology;  Laterality: N/A;  . TEE WITHOUT CARDIOVERSION N/A 09/01/2018   Procedure: TRANSESOPHAGEAL ECHOCARDIOGRAM (TEE);  Surgeon: Gaye Pollack, MD;  Location: [place];  Service: Open Heart Surgery;  Laterality: N/A;    MEDICATIONS: . acetaminophen (TYLENOL) 500 MG tablet  . ALPRAZolam (XANAX) 0.5 MG tablet  . amLODipine (NORVASC) 5 MG tablet  . aspirin EC 81 MG tablet  . carboxymethylcellulose (REFRESH PLUS) 0.5 % SOLN  . dorzolamide-timolol (COSOPT) 22.3-6.8 MG/ML ophthalmic solution  . famotidine (PEPCID) 20 MG tablet  . latanoprost (XALATAN) 0.005 % ophthalmic solution  . metoprolol succinate (TOPROL-XL) 25 MG 24 hr tablet  . Misc Natural Products (OSTEO BI-FLEX JOINT SHIELD) TABS  . rosuvastatin (CRESTOR) 40 MG tablet  . tamsulosin (FLOMAX) 0.4 MG CAPS capsule   . terbinafine (LAMISIL) tablet 250 mg     Myra Gianotti, PA-C Surgical Short Stay/Anesthesiology [facility] Phone [phone] [facility] Phone [phone] 11/28/2019 4:28 PM

## 2019-11-28 NOTE — Anesthesia Preprocedure Evaluation (Addendum)
Anesthesia Evaluation  Patient identified by MRN, date of birth, ID band Patient awake    Reviewed: Allergy & Precautions, NPO status , Patient's Chart, lab work & pertinent test results, reviewed documented beta blocker date and time   History of Anesthesia Complications Negative for: history of anesthetic complications  Airway Mallampati: III  TM Distance: >3 FB Neck ROM: Full    Dental  (+) Teeth Intact, Dental Advisory Given   Pulmonary former smoker,    Pulmonary exam normal        Cardiovascular hypertension, Pt. on medications and Pt. on home beta blockers + CAD and + CABG  Normal cardiovascular exam  Preoperative cardiology input outlined on 11/23/2019 by Coletta Memos, NP, "Given past medical history and time since last visit, based on ACC/AHA guidelines, Larry Roberts would be at acceptable risk for the planned procedure without further cardiovascular testing." He had previously been permission by cardiology to hold aspirin for 7 days prior to spinal injection.  TEE (intra-op CABG) 09/01/18:  Left ventricle: Cavity is small. Concentric hypertrophy of moderate  severity.   Left atrium: Left atrial appendage filling and emptying velocities are  normal.   Aortic valve: No stenosis.   Mitral valve: Mild mitral annular calcification. Trace regurgitation.  The annulus is mildy.   Right ventricle: Normal wall thickness and ejection fraction.   Tricuspid valve: Mild regurgitation. The tricuspid valve regurgitation  jet is central.   Pulmonic valve: Mild regurgitation.    Neuro/Psych PSYCHIATRIC DISORDERS Anxiety  Neuromuscular disease    GI/Hepatic Neg liver ROS, GERD  Medicated and Controlled,  Endo/Other  negative endocrine ROS  Renal/GU negative Renal ROS     Musculoskeletal  (+) Arthritis ,   Abdominal   Peds  Hematology negative hematology ROS (+)   Anesthesia Other Findings    Reproductive/Obstetrics                           Anesthesia Physical  Anesthesia Plan  ASA: III  Anesthesia Plan: General   Post-op Pain Management:    Induction: Intravenous  PONV Risk Score and Plan: 3 and Ondansetron, Dexamethasone and Treatment may vary due to age or medical condition  Airway Management Planned: Oral ETT  Additional Equipment: None  Intra-op Plan:   Post-operative Plan: Extubation in OR  Informed Consent: I have reviewed the patients History and Physical, chart, labs and discussed the procedure including the risks, benefits and alternatives for the proposed anesthesia with the patient or authorized representative who has indicated his/her understanding and acceptance.     Dental advisory given  Plan Discussed with: CRNA and Anesthesiologist  Anesthesia Plan Comments:        Anesthesia Quick Evaluation

## 2019-11-28 NOTE — Anesthesia Preprocedure Evaluation (Deleted)
Anesthesia Evaluation    Airway        Dental   Pulmonary former smoker,           Cardiovascular hypertension,      Neuro/Psych    GI/Hepatic   Endo/Other    Renal/GU      Musculoskeletal   Abdominal   Peds  Hematology   Anesthesia Other Findings   Reproductive/Obstetrics                             Anesthesia Physical Anesthesia Plan  ASA:   Anesthesia Plan:    Post-op Pain Management:    Induction:   PONV Risk Score and Plan:   Airway Management Planned:   Additional Equipment:   Intra-op Plan:   Post-operative Plan:   Informed Consent:   Plan Discussed with:   Anesthesia Plan Comments: (PAT note written 11/28/2019 by Myra Gianotti, PA-C. )        Anesthesia Quick Evaluation

## 2019-11-28 NOTE — Progress Notes (Signed)
PCP - Wenda Low Cardiologist - [place]  Chest x-ray - n/a EKG - 05-25-19 Stress Test - 08-29-18 ECHO - 09-01-18 Cardiac Cath - 08-30-18   Aspirin Instructions: Stopped 11-26-19   COVID TEST- 11-28-19   Anesthesia review: yes, heart history, EKG  Patient denies shortness of breath, fever, cough and chest pain at PAT appointment   All instructions explained to the patient, with a verbal understanding of the material. Patient agrees to go over the instructions while at home for a better understanding. Patient also instructed to self quarantine after being tested for COVID-19. The opportunity to ask questions was provided.

## 2019-11-29 ENCOUNTER — Encounter (HOSPITAL_COMMUNITY): Payer: Self-pay | Admitting: Neurosurgery

## 2019-11-29 ENCOUNTER — Observation Stay (HOSPITAL_COMMUNITY)
Admission: RE | Admit: 2019-11-29 | Discharge: 2019-11-29 | Disposition: A | Discharge status: Home / Self Care | Payer: Medicare Other | Attending: Neurosurgery | Admitting: Neurosurgery

## 2019-11-29 ENCOUNTER — Ambulatory Visit (HOSPITAL_COMMUNITY): Payer: Medicare Other | Admitting: Anesthesiology

## 2019-11-29 ENCOUNTER — Ambulatory Visit (HOSPITAL_COMMUNITY): Payer: Medicare Other

## 2019-11-29 ENCOUNTER — Other Ambulatory Visit: Payer: Self-pay

## 2019-11-29 ENCOUNTER — Encounter (HOSPITAL_COMMUNITY)
Admission: RE | Disposition: A | Discharge status: Home / Self Care | Payer: Self-pay | Source: Home / Self Care | Attending: Neurosurgery

## 2019-11-29 ENCOUNTER — Ambulatory Visit (HOSPITAL_COMMUNITY): Payer: Medicare Other | Admitting: Vascular Surgery

## 2019-11-29 DIAGNOSIS — M48061 Spinal stenosis, lumbar region without neurogenic claudication: Secondary | ICD-10-CM | POA: Diagnosis not present

## 2019-11-29 DIAGNOSIS — Z8546 Personal history of malignant neoplasm of prostate: Secondary | ICD-10-CM | POA: Insufficient documentation

## 2019-11-29 DIAGNOSIS — M7138 Other bursal cyst, other site: Principal | ICD-10-CM | POA: Insufficient documentation

## 2019-11-29 DIAGNOSIS — M1990 Unspecified osteoarthritis, unspecified site: Secondary | ICD-10-CM | POA: Insufficient documentation

## 2019-11-29 DIAGNOSIS — Z951 Presence of aortocoronary bypass graft: Secondary | ICD-10-CM | POA: Insufficient documentation

## 2019-11-29 DIAGNOSIS — I2510 Atherosclerotic heart disease of native coronary artery without angina pectoris: Secondary | ICD-10-CM | POA: Insufficient documentation

## 2019-11-29 DIAGNOSIS — M79671 Pain in right foot: Secondary | ICD-10-CM

## 2019-11-29 DIAGNOSIS — H409 Unspecified glaucoma: Secondary | ICD-10-CM | POA: Insufficient documentation

## 2019-11-29 DIAGNOSIS — Z7982 Long term (current) use of aspirin: Secondary | ICD-10-CM | POA: Insufficient documentation

## 2019-11-29 DIAGNOSIS — Z79899 Other long term (current) drug therapy: Secondary | ICD-10-CM | POA: Insufficient documentation

## 2019-11-29 DIAGNOSIS — K219 Gastro-esophageal reflux disease without esophagitis: Secondary | ICD-10-CM | POA: Diagnosis not present

## 2019-11-29 DIAGNOSIS — L603 Nail dystrophy: Secondary | ICD-10-CM

## 2019-11-29 DIAGNOSIS — F419 Anxiety disorder, unspecified: Secondary | ICD-10-CM | POA: Diagnosis not present

## 2019-11-29 DIAGNOSIS — Z419 Encounter for procedure for purposes other than remedying health state, unspecified: Secondary | ICD-10-CM

## 2019-11-29 DIAGNOSIS — M47816 Spondylosis without myelopathy or radiculopathy, lumbar region: Secondary | ICD-10-CM | POA: Diagnosis not present

## 2019-11-29 DIAGNOSIS — Z881 Allergy status to other antibiotic agents status: Secondary | ICD-10-CM | POA: Diagnosis not present

## 2019-11-29 DIAGNOSIS — B351 Tinea unguium: Secondary | ICD-10-CM

## 2019-11-29 DIAGNOSIS — I10 Essential (primary) hypertension: Secondary | ICD-10-CM | POA: Insufficient documentation

## 2019-11-29 HISTORY — PX: LUMBAR LAMINECTOMY/DECOMPRESSION MICRODISCECTOMY: SHX5026

## 2019-11-29 SURGERY — LUMBAR LAMINECTOMY/DECOMPRESSION MICRODISCECTOMY 1 LEVEL
Anesthesia: General | Site: Back | Laterality: Bilateral

## 2019-11-29 MED ORDER — 0.9 % SODIUM CHLORIDE (POUR BTL) OPTIME
TOPICAL | Status: DC | PRN
  Administered 2019-11-29: 1000 mL

## 2019-11-29 MED ORDER — SODIUM CHLORIDE 0.9% FLUSH: 3.0000 mL | Freq: Two times a day (BID) | INTRAVENOUS | Status: DC

## 2019-11-29 MED ORDER — BUPIVACAINE HCL (PF) 0.25 % IJ SOLN
INTRAMUSCULAR | Status: DC | PRN
  Administered 2019-11-29: 20 mL

## 2019-11-29 MED ORDER — BUPIVACAINE HCL (PF) 0.25 % IJ SOLN
INTRAMUSCULAR | Status: AC
  Filled 2019-11-29: qty 30

## 2019-11-29 MED ORDER — ACETAMINOPHEN 650 MG RE SUPP: 650.0000 mg | RECTAL | Status: DC | PRN

## 2019-11-29 MED ORDER — THROMBIN 5000 UNITS EX SOLR
CUTANEOUS | Status: DC | PRN
  Administered 2019-11-29 (×2): 5000 [IU] via TOPICAL

## 2019-11-29 MED ORDER — HYDROCODONE-ACETAMINOPHEN 5-325 MG PO TABS: 1.0000 | ORAL_TABLET | ORAL | Status: DC | PRN

## 2019-11-29 MED ORDER — ONDANSETRON HCL 4 MG/2ML IJ SOLN
INTRAMUSCULAR | Status: AC
  Filled 2019-11-29: qty 2

## 2019-11-29 MED ORDER — PHENOL 1.4 % MT LIQD: 1.0000 | OROMUCOSAL | Status: DC | PRN

## 2019-11-29 MED ORDER — AMLODIPINE BESYLATE 5 MG PO TABS: 7.5000 mg | ORAL_TABLET | Freq: Every day | ORAL | Status: DC

## 2019-11-29 MED ORDER — PHENYLEPHRINE 40 MCG/ML (10ML) SYRINGE FOR IV PUSH (FOR BLOOD PRESSURE SUPPORT)
PREFILLED_SYRINGE | INTRAVENOUS | Status: DC | PRN
  Administered 2019-11-29 (×2): 80 ug via INTRAVENOUS
  Administered 2019-11-29 (×2): 40 ug via INTRAVENOUS
  Administered 2019-11-29 (×2): 80 ug via INTRAVENOUS

## 2019-11-29 MED ORDER — KETOROLAC TROMETHAMINE 30 MG/ML IJ SOLN
INTRAMUSCULAR | Status: AC
  Filled 2019-11-29: qty 1

## 2019-11-29 MED ORDER — SUFENTANIL CITRATE 50 MCG/ML IV SOLN
INTRAVENOUS | Status: AC
  Filled 2019-11-29: qty 1

## 2019-11-29 MED ORDER — PHENYLEPHRINE HCL-NACL 10-0.9 MG/250ML-% IV SOLN
INTRAVENOUS | Status: DC | PRN
Start: 2019-11-29 — End: 2019-11-29
  Administered 2019-11-29: 25 ug/min via INTRAVENOUS

## 2019-11-29 MED ORDER — LIDOCAINE 2% (20 MG/ML) 5 ML SYRINGE
INTRAMUSCULAR | Status: AC
  Filled 2019-11-29: qty 5

## 2019-11-29 MED ORDER — ORAL CARE MOUTH RINSE: 15.0000 mL | Freq: Once | OROMUCOSAL | Status: AC

## 2019-11-29 MED ORDER — HEMOSTATIC AGENTS (NO CHARGE) OPTIME
TOPICAL | Status: DC | PRN
  Administered 2019-11-29: 1 via TOPICAL

## 2019-11-29 MED ORDER — FENTANYL CITRATE (PF) 100 MCG/2ML IJ SOLN
INTRAMUSCULAR | Status: AC
  Filled 2019-11-29: qty 2

## 2019-11-29 MED ORDER — CHLORHEXIDINE GLUCONATE 0.12 % MT SOLN
OROMUCOSAL | Status: AC
  Administered 2019-11-29: 15 mL via OROMUCOSAL
  Filled 2019-11-29: qty 15

## 2019-11-29 MED ORDER — ONDANSETRON HCL 4 MG/2ML IJ SOLN: 4.0000 mg | Freq: Four times a day (QID) | INTRAMUSCULAR | Status: DC | PRN

## 2019-11-29 MED ORDER — CHLORHEXIDINE GLUCONATE CLOTH 2 % EX PADS: 6.0000 | MEDICATED_PAD | Freq: Once | CUTANEOUS | Status: DC

## 2019-11-29 MED ORDER — ROCURONIUM BROMIDE 10 MG/ML (PF) SYRINGE
PREFILLED_SYRINGE | INTRAVENOUS | Status: DC | PRN
  Administered 2019-11-29: 80 mg via INTRAVENOUS

## 2019-11-29 MED ORDER — ROCURONIUM BROMIDE 10 MG/ML (PF) SYRINGE
PREFILLED_SYRINGE | INTRAVENOUS | Status: AC
  Filled 2019-11-29: qty 10

## 2019-11-29 MED ORDER — HYDROMORPHONE HCL 1 MG/ML IJ SOLN: 1.0000 mg | INTRAMUSCULAR | Status: DC | PRN

## 2019-11-29 MED ORDER — MIDAZOLAM HCL 2 MG/2ML IJ SOLN
INTRAMUSCULAR | Status: AC
  Filled 2019-11-29: qty 2

## 2019-11-29 MED ORDER — ACETAMINOPHEN 325 MG PO TABS: 650.0000 mg | ORAL_TABLET | ORAL | Status: DC | PRN

## 2019-11-29 MED ORDER — CYCLOBENZAPRINE HCL 10 MG PO TABS: 10.0000 mg | ORAL_TABLET | Freq: Three times a day (TID) | ORAL | 0 refills | Status: AC | PRN

## 2019-11-29 MED ORDER — ROSUVASTATIN CALCIUM 20 MG PO TABS: 40.0000 mg | ORAL_TABLET | Freq: Every day | ORAL | Status: DC

## 2019-11-29 MED ORDER — DORZOLAMIDE HCL-TIMOLOL MAL 2-0.5 % OP SOLN
1.0000 [drp] | Freq: Two times a day (BID) | OPHTHALMIC | Status: DC
  Filled 2019-11-29: qty 10

## 2019-11-29 MED ORDER — OSTEO BI-FLEX JOINT SHIELD PO TABS: 1.0000 | ORAL_TABLET | Freq: Every day | ORAL | Status: DC

## 2019-11-29 MED ORDER — LACTATED RINGERS IV SOLN: INTRAVENOUS | Status: DC | PRN

## 2019-11-29 MED ORDER — MIDAZOLAM HCL 5 MG/5ML IJ SOLN
INTRAMUSCULAR | Status: DC | PRN
  Administered 2019-11-29: 2 mg via INTRAVENOUS

## 2019-11-29 MED ORDER — KETOROLAC TROMETHAMINE 15 MG/ML IJ SOLN
30.0000 mg | Freq: Four times a day (QID) | INTRAMUSCULAR | Status: DC
  Administered 2019-11-29: 30 mg via INTRAVENOUS
  Filled 2019-11-29: qty 2

## 2019-11-29 MED ORDER — PROPOFOL 10 MG/ML IV BOLUS
INTRAVENOUS | Status: AC
  Filled 2019-11-29: qty 20

## 2019-11-29 MED ORDER — CEFAZOLIN SODIUM-DEXTROSE 1-4 GM/50ML-% IV SOLN
1.0000 g | Freq: Three times a day (TID) | INTRAVENOUS | Status: DC
  Administered 2019-11-29: 1 g via INTRAVENOUS
  Filled 2019-11-29: qty 50

## 2019-11-29 MED ORDER — SUGAMMADEX SODIUM 200 MG/2ML IV SOLN
INTRAVENOUS | Status: DC | PRN
  Administered 2019-11-29: 200 mg via INTRAVENOUS

## 2019-11-29 MED ORDER — SODIUM CHLORIDE 0.9 % IV SOLN: 250.0000 mL | INTRAVENOUS | Status: DC

## 2019-11-29 MED ORDER — SODIUM CHLORIDE 0.9 % IV SOLN
250.0000 mL | INTRAVENOUS | Status: DC
Start: 2019-11-29 — End: 2019-11-29

## 2019-11-29 MED ORDER — MENTHOL 3 MG MT LOZG: 1.0000 | LOZENGE | OROMUCOSAL | Status: DC | PRN

## 2019-11-29 MED ORDER — CARBOXYMETHYLCELLULOSE SODIUM 0.5 % OP SOLN: 1.0000 [drp] | Freq: Every day | OPHTHALMIC | Status: DC | PRN

## 2019-11-29 MED ORDER — ONDANSETRON HCL 4 MG PO TABS: 4.0000 mg | ORAL_TABLET | Freq: Four times a day (QID) | ORAL | Status: DC | PRN

## 2019-11-29 MED ORDER — ACETAMINOPHEN 500 MG PO TABS
1000.0000 mg | ORAL_TABLET | Freq: Once | ORAL | Status: AC
  Administered 2019-11-29: 1000 mg via ORAL
  Filled 2019-11-29: qty 2

## 2019-11-29 MED ORDER — POLYVINYL ALCOHOL 1.4 % OP SOLN
1.0000 [drp] | OPHTHALMIC | Status: DC | PRN
  Filled 2019-11-29: qty 15

## 2019-11-29 MED ORDER — PHENYLEPHRINE 40 MCG/ML (10ML) SYRINGE FOR IV PUSH (FOR BLOOD PRESSURE SUPPORT)
PREFILLED_SYRINGE | INTRAVENOUS | Status: AC
  Filled 2019-11-29: qty 10

## 2019-11-29 MED ORDER — CYCLOBENZAPRINE HCL 10 MG PO TABS
10.0000 mg | ORAL_TABLET | Freq: Three times a day (TID) | ORAL | Status: DC | PRN
  Administered 2019-11-29: 10 mg via ORAL
  Filled 2019-11-29: qty 1

## 2019-11-29 MED ORDER — ONDANSETRON HCL 4 MG/2ML IJ SOLN
INTRAMUSCULAR | Status: DC | PRN
  Administered 2019-11-29: 4 mg via INTRAVENOUS

## 2019-11-29 MED ORDER — SODIUM CHLORIDE (PF) 0.9 % IJ SOLN
INTRAMUSCULAR | Status: AC
  Filled 2019-11-29: qty 10

## 2019-11-29 MED ORDER — THROMBIN 5000 UNITS EX SOLR
CUTANEOUS | Status: AC
  Filled 2019-11-29: qty 10000

## 2019-11-29 MED ORDER — FENTANYL CITRATE (PF) 100 MCG/2ML IJ SOLN
25.0000 ug | INTRAMUSCULAR | Status: DC | PRN
  Administered 2019-11-29: 25 ug via INTRAVENOUS

## 2019-11-29 MED ORDER — LIDOCAINE 2% (20 MG/ML) 5 ML SYRINGE
INTRAMUSCULAR | Status: DC | PRN
  Administered 2019-11-29: 100 mg via INTRAVENOUS

## 2019-11-29 MED ORDER — SODIUM CHLORIDE 0.9% FLUSH: 3.0000 mL | INTRAVENOUS | Status: DC | PRN

## 2019-11-29 MED ORDER — TAMSULOSIN HCL 0.4 MG PO CAPS: 0.4000 mg | ORAL_CAPSULE | Freq: Every day | ORAL | Status: DC | PRN

## 2019-11-29 MED ORDER — FAMOTIDINE 20 MG PO TABS: 20.0000 mg | ORAL_TABLET | Freq: Every day | ORAL | Status: DC | PRN

## 2019-11-29 MED ORDER — DEXAMETHASONE SODIUM PHOSPHATE 10 MG/ML IJ SOLN
10.0000 mg | Freq: Once | INTRAMUSCULAR | Status: AC
  Administered 2019-11-29: 10 mg via INTRAVENOUS
  Filled 2019-11-29: qty 1

## 2019-11-29 MED ORDER — CHLORHEXIDINE GLUCONATE 0.12 % MT SOLN: 15.0000 mL | Freq: Once | OROMUCOSAL | Status: AC

## 2019-11-29 MED ORDER — CELECOXIB 200 MG PO CAPS
200.0000 mg | ORAL_CAPSULE | Freq: Once | ORAL | Status: AC
  Administered 2019-11-29: 200 mg via ORAL
  Filled 2019-11-29: qty 1

## 2019-11-29 MED ORDER — LATANOPROST 0.005 % OP SOLN
1.0000 [drp] | Freq: Every day | OPHTHALMIC | Status: DC
  Filled 2019-11-29: qty 2.5

## 2019-11-29 MED ORDER — SUFENTANIL CITRATE 50 MCG/ML IV SOLN
INTRAVENOUS | Status: DC | PRN
  Administered 2019-11-29: 20 ug via INTRAVENOUS

## 2019-11-29 MED ORDER — CEFAZOLIN SODIUM-DEXTROSE 2-4 GM/100ML-% IV SOLN
2.0000 g | INTRAVENOUS | Status: AC
  Administered 2019-11-29: 2 g via INTRAVENOUS
  Filled 2019-11-29: qty 100

## 2019-11-29 MED ORDER — SODIUM CHLORIDE 0.9 % IV SOLN
INTRAVENOUS | Status: DC | PRN
  Administered 2019-11-29: 500 mL

## 2019-11-29 MED ORDER — ALPRAZOLAM 0.25 MG PO TABS: 0.2500 mg | ORAL_TABLET | Freq: Two times a day (BID) | ORAL | Status: DC | PRN

## 2019-11-29 MED ORDER — HYDROCODONE-ACETAMINOPHEN 5-325 MG PO TABS: 1.0000 | ORAL_TABLET | ORAL | 0 refills | Status: AC | PRN

## 2019-11-29 MED ORDER — HYDROCODONE-ACETAMINOPHEN 10-325 MG PO TABS
2.0000 | ORAL_TABLET | ORAL | Status: DC | PRN
  Administered 2019-11-29: 2 via ORAL
  Filled 2019-11-29: qty 2

## 2019-11-29 MED ORDER — KETOROLAC TROMETHAMINE 30 MG/ML IJ SOLN
INTRAMUSCULAR | Status: DC | PRN
  Administered 2019-11-29: 30 mg via INTRAVENOUS

## 2019-11-29 MED ORDER — TERBINAFINE HCL 250 MG PO TABS: 250.0000 mg | ORAL_TABLET | Freq: Every day | ORAL | Status: DC

## 2019-11-29 MED ORDER — METOPROLOL SUCCINATE ER 25 MG PO TB24: 25.0000 mg | ORAL_TABLET | Freq: Every day | ORAL | Status: DC

## 2019-11-29 MED ORDER — PROMETHAZINE HCL 25 MG/ML IJ SOLN: 6.2500 mg | INTRAMUSCULAR | Status: DC | PRN

## 2019-11-29 MED ORDER — PROPOFOL 10 MG/ML IV BOLUS
INTRAVENOUS | Status: DC | PRN
  Administered 2019-11-29: 30 mg via INTRAVENOUS
  Administered 2019-11-29: 150 mg via INTRAVENOUS

## 2019-11-29 SURGICAL SUPPLY — 56 items
ADH SKN CLS APL DERMABOND .7 (GAUZE/BANDAGES/DRESSINGS) ×1
APL SKNCLS STERI-STRIP NONHPOA (GAUZE/BANDAGES/DRESSINGS) ×1
BAG DECANTER FOR FLEXI CONT (MISCELLANEOUS) ×3 IMPLANT
BAND INSRT 18 STRL LF DISP RB (MISCELLANEOUS) ×2
BAND RUBBER #18 3X1/16 STRL (MISCELLANEOUS) ×6 IMPLANT
BENZOIN TINCTURE PRP APPL 2/3 (GAUZE/BANDAGES/DRESSINGS) ×3 IMPLANT
BLADE CLIPPER SURG (BLADE) IMPLANT
BUR CUTTER 7.0 ROUND (BURR) ×3 IMPLANT
CANISTER SUCT 3000ML PPV (MISCELLANEOUS) ×3 IMPLANT
CARTRIDGE OIL MAESTRO DRILL (MISCELLANEOUS) ×1 IMPLANT
CLOSURE WOUND 1/2 X4 (GAUZE/BANDAGES/DRESSINGS) ×1
COVER WAND RF STERILE (DRAPES) ×3 IMPLANT
DECANTER SPIKE VIAL GLASS SM (MISCELLANEOUS) ×3 IMPLANT
DERMABOND ADVANCED (GAUZE/BANDAGES/DRESSINGS) ×2
DERMABOND ADVANCED .7 DNX12 (GAUZE/BANDAGES/DRESSINGS) ×1 IMPLANT
DIFFUSER DRILL AIR PNEUMATIC (MISCELLANEOUS) ×3 IMPLANT
DRAPE HALF SHEET 40X57 (DRAPES) IMPLANT
DRAPE LAPAROTOMY 100X72X124 (DRAPES) ×3 IMPLANT
DRAPE MICROSCOPE LEICA (MISCELLANEOUS) ×3 IMPLANT
DRAPE SURG 17X23 STRL (DRAPES) ×6 IMPLANT
DRSG OPSITE POSTOP 4X6 (GAUZE/BANDAGES/DRESSINGS) ×2 IMPLANT
DURAPREP 26ML APPLICATOR (WOUND CARE) ×3 IMPLANT
ELECT REM PT RETURN 9FT ADLT (ELECTROSURGICAL) ×3
ELECTRODE REM PT RTRN 9FT ADLT (ELECTROSURGICAL) ×1 IMPLANT
GAUZE 4X4 16PLY RFD (DISPOSABLE) IMPLANT
GAUZE SPONGE 4X4 12PLY STRL (GAUZE/BANDAGES/DRESSINGS) ×3 IMPLANT
GLOVE BIO SURGEON STRL SZ 6.5 (GLOVE) ×2 IMPLANT
GLOVE BIO SURGEON STRL SZ7 (GLOVE) ×4 IMPLANT
GLOVE BIO SURGEONS STRL SZ 6.5 (GLOVE) ×1
GLOVE BIOGEL PI IND STRL 6.5 (GLOVE) ×1 IMPLANT
GLOVE BIOGEL PI IND STRL 7.5 (GLOVE) IMPLANT
GLOVE BIOGEL PI INDICATOR 6.5 (GLOVE) ×2
GLOVE BIOGEL PI INDICATOR 7.5 (GLOVE) ×4
GLOVE ECLIPSE 9.0 STRL (GLOVE) ×3 IMPLANT
GLOVE EXAM NITRILE XL STR (GLOVE) IMPLANT
GOWN STRL REUS W/ TWL LRG LVL3 (GOWN DISPOSABLE) IMPLANT
GOWN STRL REUS W/ TWL XL LVL3 (GOWN DISPOSABLE) ×1 IMPLANT
GOWN STRL REUS W/TWL 2XL LVL3 (GOWN DISPOSABLE) ×2 IMPLANT
GOWN STRL REUS W/TWL LRG LVL3 (GOWN DISPOSABLE)
GOWN STRL REUS W/TWL XL LVL3 (GOWN DISPOSABLE) ×3
KIT BASIN OR (CUSTOM PROCEDURE TRAY) ×3 IMPLANT
KIT TURNOVER KIT B (KITS) ×3 IMPLANT
NDL SPNL 22GX3.5 QUINCKE BK (NEEDLE) IMPLANT
NEEDLE HYPO 22GX1.5 SAFETY (NEEDLE) ×3 IMPLANT
NEEDLE SPNL 22GX3.5 QUINCKE BK (NEEDLE) ×3 IMPLANT
NS IRRIG 1000ML POUR BTL (IV SOLUTION) ×3 IMPLANT
OIL CARTRIDGE MAESTRO DRILL (MISCELLANEOUS) ×3
PACK LAMINECTOMY NEURO (CUSTOM PROCEDURE TRAY) ×3 IMPLANT
PAD ARMBOARD 7.5X6 YLW CONV (MISCELLANEOUS) ×3 IMPLANT
SPONGE SURGIFOAM ABS GEL SZ50 (HEMOSTASIS) ×3 IMPLANT
STRIP CLOSURE SKIN 1/2X4 (GAUZE/BANDAGES/DRESSINGS) ×2 IMPLANT
SUT VIC AB 2-0 CT1 18 (SUTURE) ×3 IMPLANT
SUT VIC AB 3-0 SH 8-18 (SUTURE) ×3 IMPLANT
TOWEL GREEN STERILE (TOWEL DISPOSABLE) ×3 IMPLANT
TOWEL GREEN STERILE FF (TOWEL DISPOSABLE) ×3 IMPLANT
WATER STERILE IRR 1000ML POUR (IV SOLUTION) ×3 IMPLANT

## 2019-11-29 NOTE — Discharge Summary (Signed)
Physician Discharge Summary  Patient ID: Larry Roberts MRN: [id number] DOB/AGE: 1941/02/11 79 y.o.  Admit date: 11/29/2019 Discharge date: 11/29/2019  Admission Diagnoses:  Discharge Diagnoses:  Active Problems:   Synovial cyst of lumbar facet joint   Discharged Condition: good  Hospital Course: Patient admitted to the hospital where he underwent uncomplicated lumbar decompression and resection of synovial cyst at L4-5.  [name] doing well.  Standing walking voiding without issues.  Pain very well controlled.  Significantly improved from preop.  Ready for discharge home.  Consults:   Significant Diagnostic Studies:   Treatments:   Discharge Exam: Blood pressure 117/74, pulse (!) 52, temperature (!) 97.5 F (36.4 C), temperature source Oral, resp. rate 16, height 5\' 10"  (1.778 m), weight 87.5 kg, SpO2 100 %. Awake and alert.  Oriented and appropriate.  Motor and sensory function intact.  Wound clean and dry.  Chest and abdomen benign.  Disposition: Discharge disposition: 01-Home or Self Care        Allergies as of 11/29/2019      Reactions   Tetanus Toxoids Other (See Comments)   SYNCOPE "black out"   Levaquin [levofloxacin] Itching      Medication List    TAKE these medications   acetaminophen 500 MG tablet Commonly known as: TYLENOL Take 500 mg by mouth daily.   ALPRAZolam 0.5 MG tablet Commonly known as: XANAX Take 0.25 mg by mouth 2 (two) times daily as needed for anxiety or sleep.   amLODipine 5 MG tablet Commonly known as: NORVASC Take 7.5 mg by mouth daily.   aspirin EC 81 MG tablet Take 81 mg by mouth daily.   carboxymethylcellulose 0.5 % Soln Commonly known as: REFRESH PLUS Place 1 drop into both eyes daily as needed (dry/irritated eyes.).   cyclobenzaprine 10 MG tablet Commonly known as: FLEXERIL Take 1 tablet (10 mg total) by mouth 3 (three) times daily as needed for muscle spasms.   dorzolamide-timolol 22.3-6.8 MG/ML ophthalmic  solution Commonly known as: COSOPT Place 1 drop into both eyes 2 (two) times daily.   famotidine 20 MG tablet Commonly known as: PEPCID Take 20 mg by mouth daily as needed for heartburn.   HYDROcodone-acetaminophen 5-325 MG tablet Commonly known as: NORCO/VICODIN Take 1 tablet by mouth every 4 (four) hours as needed for moderate pain ((score 4 to 6)).   latanoprost 0.005 % ophthalmic solution Commonly known as: XALATAN Place 1 drop into both eyes at bedtime.   metoprolol succinate 25 MG 24 hr tablet Commonly known as: TOPROL-XL Take 1 tablet by mouth once daily   Osteo Bi-Flex Joint Shield Tabs Take 1 tablet by mouth daily.   rosuvastatin 40 MG tablet Commonly known as: CRESTOR TAKE 1 TABLET BY MOUTH AT BEDTIME   tamsulosin 0.4 MG Caps capsule Commonly known as: FLOMAX TAKE 1 CAPSULE BY MOUTH ONCE DAILY AFTER SUPPER What changed: See the new instructions.        Signed: Cooper Render Dontay Harm 11/29/2019, 5:32 PM

## 2019-11-29 NOTE — Anesthesia Postprocedure Evaluation (Signed)
Anesthesia Post Note  Patient: Larry Roberts  Procedure(s) Performed: Laminectomy for facet/synovial cyst - bilateral - Lumbar four-Lumbar five (Bilateral Back)     Patient location during evaluation: PACU Anesthesia Type: General Level of consciousness: sedated Pain management: pain level controlled Vital Signs Assessment: post-procedure vital signs reviewed and stable Respiratory status: spontaneous breathing and respiratory function stable Cardiovascular status: stable Postop Assessment: no apparent nausea or vomiting Anesthetic complications: no   No complications documented.  Last Vitals:  Vitals:   11/29/19 1048 11/29/19 1111  BP: 123/71 117/74  Pulse: (!) 50 (!) 52  Resp: 12 16  Temp: 36.5 C (!) 36.4 C  SpO2: 92% 100%    Last Pain:  Vitals:   11/29/19 1111  TempSrc: Oral  PainSc:                  Tenille Morrill DANIEL

## 2019-11-29 NOTE — Evaluation (Signed)
Occupational Therapy Evaluation Patient Details Name: Larry Roberts MRN: [id number] DOB: 10/13/1940 Today's Date: 11/29/2019    History of Present Illness 79 y/o male s/p bil L4-5 decompressive laminotomies, resection of synovial cyst, microdissection. PMH includes CTS of L wrist, glaucoma, prostate cancer, CAD, HTN.   Clinical Impression   PTA pt living with spouse, independent for BADL/IADL. At time of eval, pt presents with ability to complete bed mobility and transfer at independent level. He also shows ability to complete BADL independently while following spinal precautions. Reviewed safe IADL when returning home as well. Encouraged pt to start walking program of one time per hour. Back handout provided and reviewed adls in detail. Pt educated on: set an alarm at night for medication, avoid sitting for long periods of time, correct bed positioning for sleeping, correct sequence for bed mobility, avoiding lifting more than 5 pounds and never wash directly over incision. All education is complete and patient indicates understanding. No further OT needs identified, OT will sign off. Thank you for this consult.     Follow Up Recommendations  No OT follow up    Equipment Recommendations  None recommended by OT    Recommendations for Other Services       Precautions / Restrictions Precautions Precautions: Back Precaution Booklet Issued: Yes (comment) Precaution Comments: reviewed in context of BADL Restrictions Weight Bearing Restrictions: No Other Position/Activity Restrictions: no brace needed      Mobility Bed Mobility Overal bed mobility: Independent                Transfers Overall transfer level: Independent                    Balance Overall balance assessment: Mild deficits observed, not formally tested                                         ADL either performed or assessed with clinical judgement   ADL Overall ADL's :  Independent                                       General ADL Comments: Pt demonstrated ability to complete independent BADL. Pt donned own UB/LB closing without external assist and good use of figure 4 method. He was able to don tennis shoes in figure 4 method as well. Pt also completed functional mobility beyond household distance with no AD, LOB, or safety concerns noticed     Vision Patient Visual Report: No change from baseline       Perception     Praxis      Pertinent Vitals/Pain Pain Assessment: Faces Faces Pain Scale: Hurts little more Pain Location: lumbar incisional site Pain Descriptors / Indicators: Aching Pain Intervention(s): Monitored during session     Hand Dominance     Extremity/Trunk Assessment Upper Extremity Assessment Upper Extremity Assessment: Overall WFL for tasks assessed   Lower Extremity Assessment Lower Extremity Assessment: Overall WFL for tasks assessed       Communication Communication Communication: No difficulties   Cognition Arousal/Alertness: Awake/alert Behavior During Therapy: WFL for tasks assessed/performed Overall Cognitive Status: Within Functional Limits for tasks assessed  General Comments       Exercises     Shoulder Instructions      Home Living Family/patient expects to be discharged to:: Private residence Living Arrangements: Spouse/significant other Available Help at Discharge: Family;Available 24 hours/day Type of Home: House Home Access: Stairs to enter [employer] of Steps: 3-4   Home Layout: One level     Bathroom Shower/Tub: Tub/shower unit;Walk-in shower   Bathroom Toilet: Standard     Home Equipment: None          Prior Functioning/Environment Level of Independence: Independent                 OT Problem List: Decreased knowledge of use of DME or AE;Decreased knowledge of precautions;Pain      OT  Treatment/Interventions:      OT Goals(Current goals can be found in the care plan section) Acute Rehab OT Goals Patient Stated Goal: return to mowing the lawn and walking 2 miles a day OT Goal Formulation: All assessment and education complete, DC therapy  OT Frequency:     Barriers to D/C:            Co-evaluation              AM-PAC OT "6 Clicks" Daily Activity     Outcome Measure Help from another person eating meals?: None Help from another person taking care of personal grooming?: None Help from another person toileting, which includes using toliet, bedpan, or urinal?: None Help from another person bathing (including washing, rinsing, drying)?: None Help from another person to put on and taking off regular upper body clothing?: None Help from another person to put on and taking off regular lower body clothing?: None 6 Click Score: 24   End of Session Nurse Communication: Mobility status  Activity Tolerance: Patient tolerated treatment well Patient left: in bed  OT Visit Diagnosis: Other abnormalities of gait and mobility (R26.89);Pain Pain - part of body:  (back)                Time: 9292-4462 OT Time Calculation (min): 20 min Charges:  OT General Charges $OT Visit: 1 Visit OT Evaluation $OT Eval Low Complexity: 1 Low  Zenovia Jarred, MSOT, OTR/L [facility] Office Number: [phone] Pager: [phone]  Zenovia Jarred 11/29/2019, 3:00 PM

## 2019-11-29 NOTE — Op Note (Addendum)
Date of procedure: 11/29/2019  Date of dictation:.  Patient  Service: Neurosurgery  Preoperative diagnosis: L4-5 stenosis with adherent synovial cyst  Postoperative diagnosis: Same  Procedure Name: Bilateral L4-5 decompressive laminotomies Resection of synovial cyst, microdissection  Surgeon:Tylicia Sherman A.Maximillian Habibi, M.D.  Asst. Surgeon: Reinaldo Meeker,  Anesthesia: General  Indication: 79 year old male with back and bilateral lower extremity symptoms regularly left consistent with neurogenic claudication and a significant right-sided L5 radiculopathy.  Work-up demonstrates evidence of marked spondylosis with stenosis and associated moderately large dorsal synovial cyst causing critical stenosis at the L4-5 level.  Patient presents now for decompressive surgery and resection of cyst in hopes of improving his symptoms.  Operative note: After induction of anesthesia, patient positioned prone on the Wilson frame and properly padded prepped and draped sterilely.  Incision made overlying L4-5.  Dissection first on the left side.  Retractor placed.  X-ray taken .  L5 S1 localized.  Dissection performed at L4-5 bilaterally.  Retractor placed.  Decompressive laminotomies were then performed and high-speed drill and Kerrison rongeurs to remove the inferior aspect of the lamina of L4 medial aspect of the L4-5 facet joints and the superior rim of the L5 lamina.  Ligament flavum elevated and resected.  Foraminotomies completed on the course exiting L4 and L5 nerve roots bilaterally.  A dorsal adherent midline cyst was dissected free using the microscope for microdissection.  This was removed completely.  The thecal sac was left intact.  There is no evidence of any residual stenosis.  There was no overt instability.  This patient were examined on both sides and found to be free from herniation.  Wound is irrigated with antibiotic solution.  Gelfoam was placed topically for hemostasis.  Wound is then closed in layers of Vicryl  sutures.  Steri-Strips and sterile dressing were applied.  No apparent complications.  Patient tolerated the procedure well and he returns to recovery room postop.

## 2019-11-29 NOTE — H&P (Signed)
Larry Roberts is an 79 y.o. male.   Chief Complaint: Leg pain and weakness HPI: 79 year old male with a few month history of bilateral lower extremity pain with associated numbness and weakness right worse than the left.  Symptoms aggravated by standing or walking.  Patient notes some weakness in his right foot and is beginning to drag his right foot when he walks.  Symptoms are improved with sitting or lying down.  Work-up demonstrates evidence of moderately severe multifactorial stenosis complicated by a dorsal synovial cyst causing severe stenosis at L4-5.  Patient has failed conservative management and presents now for decompression and resection of synovial cyst.  Past Medical History:  Diagnosis Date  . Anxiety   . Arthritis   . Carpal tunnel syndrome of left wrist   . Coronary artery disease   . GERD (gastroesophageal reflux disease)   . Glaucoma, both eyes   . Hx of colonic polyps   . Hypertension   . Nocturia   . Prostate cancer [facility]) urologist-  dr borden/  oncologist-- dr Tammi Klippel   dx 08/ 2018 Gleason 3+3, PSA 6.8 active surveillane/  dx 09-15-2017 Stage T2a, Gleason 3+4, PSA 3.74-- plan external beam radiation and ADT    Past Surgical History:  Procedure Laterality Date  . CARPAL TUNNEL RELEASE Right 06-30-2007   dr sypher   w/  A-1 pulley release right index and long fingers  . CATARACT EXTRACTION W/ INTRAOCULAR LENS  IMPLANT, BILATERAL  2012 approx.  . COLONOSCOPY W/ POLYPECTOMY    . CORONARY ARTERY BYPASS GRAFT N/A 09/01/2018   Procedure: CORONARY ARTERY BYPASS GRAFTING (CABG)  x two, using left internal mammary and right saphenous vein (endoscopic vein harvest).;  Surgeon: Gaye Pollack, MD;  Location: [facility] OR;  Service: Open Heart Surgery;  Laterality: N/A;  . GOLD SEED IMPLANT N/A 11/14/2017   Procedure: GOLD SEED IMPLANT;  Surgeon: Raynelle Bring, MD;  Location: [facility];  Service: Urology;  Laterality: N/A;  Needs Ultrasound Tech  . LAPAROSCOPY  CECECTOMY W/ APPENDECTOMY  05-20-2009  dr Margot Chimes   cecal polyp at appendiceal base  . LEFT HEART CATH AND CORONARY ANGIOGRAPHY N/A 08/30/2018   Procedure: LEFT HEART CATH AND CORONARY ANGIOGRAPHY;  Surgeon: Belva Crome, MD;  Location: [facility];  Service: Cardiovascular;  Laterality: N/A;  . PROSTATE BIOPSY  09-15-2017;  08/ 2018  . SHOULDER ARTHROSCOPY DISTAL CLAVICLE EXCISION AND OPEN ROTATOR CUFF REPAIR Right 08-06-2008   dr sypher   w/ debridement, SCD, bursectomy, acrominoplasty and Biceps tenodesis  . SHOULDER ARTHROSCOPY WITH OPEN ROTATOR CUFF REPAIR Left 2018   "and tendon repair's"  . SPACE OAR INSTILLATION N/A 11/14/2017   Procedure: SPACE OAR INSTILLATION;  Surgeon: Raynelle Bring, MD;  Location: [facility];  Service: Urology;  Laterality: N/A;  . TEE WITHOUT CARDIOVERSION N/A 09/01/2018   Procedure: TRANSESOPHAGEAL ECHOCARDIOGRAM (TEE);  Surgeon: Gaye Pollack, MD;  Location: [facility];  Service: Open Heart Surgery;  Laterality: N/A;    Family History  Problem Relation Age of Onset  . Prostate cancer Brother   . Hodgkin's lymphoma Daughter   . Breast cancer Daughter   . Melanoma Daughter   . Throat cancer Father   . Breast cancer Sister   . Breast cancer Sister   . Thyroid cancer Sister    Social History:  reports that he quit smoking about 33 years ago. His smoking use included cigarettes. He quit after 25.00 years of use. He quit smokeless  tobacco use about 31 years ago.  His smokeless tobacco use included chew. He reports current alcohol use of about 3.0 standard drinks of alcohol per week. He reports that he does not use drugs.  Allergies:  Allergies  Allergen Reactions  . Tetanus Toxoids Other (See Comments)    SYNCOPE "black out"  . Levaquin [Levofloxacin] Itching    Facility-Administered Medications Prior to Admission  Medication Dose Route Frequency Provider Last Rate Last Admin  . terbinafine (LAMISIL) tablet 250 mg  250 mg Oral Daily  Felipa Furnace, DPM       Medications Prior to Admission  Medication Sig Dispense Refill  . acetaminophen (TYLENOL) 500 MG tablet Take 500 mg by mouth daily.     Marland Kitchen ALPRAZolam (XANAX) 0.5 MG tablet Take 0.25 mg by mouth 2 (two) times daily as needed for anxiety or sleep.     Marland Kitchen amLODipine (NORVASC) 5 MG tablet Take 7.5 mg by mouth daily.   0  . aspirin EC 81 MG tablet Take 81 mg by mouth daily.    . carboxymethylcellulose (REFRESH PLUS) 0.5 % SOLN Place 1 drop into both eyes daily as needed (dry/irritated eyes.).     Marland Kitchen dorzolamide-timolol (COSOPT) 22.3-6.8 MG/ML ophthalmic solution Place 1 drop into both eyes 2 (two) times daily.   6  . famotidine (PEPCID) 20 MG tablet Take 20 mg by mouth daily as needed for heartburn.    . latanoprost (XALATAN) 0.005 % ophthalmic solution Place 1 drop into both eyes at bedtime.   2  . metoprolol succinate (TOPROL-XL) 25 MG 24 hr tablet Take 1 tablet by mouth once daily (Patient taking differently: Take 25 mg by mouth daily. ) 90 tablet 1  . Misc Natural Products (OSTEO BI-FLEX JOINT SHIELD) TABS Take 1 tablet by mouth daily.     . rosuvastatin (CRESTOR) 40 MG tablet TAKE 1 TABLET BY MOUTH AT BEDTIME (Patient taking differently: Take 40 mg by mouth at bedtime. ) 90 tablet 3  . tamsulosin (FLOMAX) 0.4 MG CAPS capsule TAKE 1 CAPSULE BY MOUTH ONCE DAILY AFTER SUPPER (Patient taking differently: Take 0.4 mg by mouth daily as needed (urine flow). ) 30 capsule 0    Results for orders placed or performed during the hospital encounter of 11/28/19 (from the past 48 hour(s))  Basic metabolic panel     Status: None   Collection Time: 11/28/19  2:48 PM  Result Value Ref Range   Sodium 143 135 - 145 mmol/L   Potassium 4.0 3.5 - 5.1 mmol/L   Chloride 111 98 - 111 mmol/L   CO2 23 22 - 32 mmol/L   Glucose, Bld 97 70 - 99 mg/dL    Comment: Glucose reference range applies only to samples taken after fasting for at least 8 hours.   BUN 12 8 - 23 mg/dL   Creatinine, Ser 1.06  0.61 - 1.24 mg/dL   Calcium 9.8 8.9 - 10.3 mg/dL   GFR calc non Af Amer >60 >60 mL/min   GFR calc Af Amer >60 >60 mL/min   Anion gap 9 5 - 15    Comment: Performed at [facility] [address], [place], [place] [address]  CBC WITH DIFFERENTIAL     Status: None   Collection Time: 11/28/19  2:48 PM  Result Value Ref Range   WBC 5.0 4.0 - 10.5 K/uL   RBC 4.91 4.22 - 5.81 MIL/uL   Hemoglobin 15.4 13.0 - 17.0 g/dL   HCT 44.3 39 -  52 %   MCV 90.2 80.0 - 100.0 fL   MCH 31.4 26.0 - 34.0 pg   MCHC 34.8 30.0 - 36.0 g/dL   RDW 12.8 11.5 - 15.5 %   Platelets 186 150 - 400 K/uL   nRBC 0.0 0.0 - 0.2 %   Neutrophils Relative % 57 %   Neutro Abs 2.8 1.7 - 7.7 K/uL   Lymphocytes Relative 28 %   Lymphs Abs 1.4 0.7 - 4.0 K/uL   Monocytes Relative 12 %   Monocytes Absolute 0.6 0 - 1 K/uL   Eosinophils Relative 2 %   Eosinophils Absolute 0.1 0 - 0 K/uL   Basophils Relative 1 %   Basophils Absolute 0.1 0 - 0 K/uL   Immature Granulocytes 0 %   Abs Immature Granulocytes 0.01 0.00 - 0.07 K/uL    Comment: Performed at [facility] [address] [place], [place] [address]  Surgical pcr screen     Status: None   Collection Time: 11/28/19  2:48 PM   Specimen: Nasal Mucosa; Nasal Swab  Result Value Ref Range   MRSA, PCR NEGATIVE NEGATIVE   Staphylococcus aureus NEGATIVE NEGATIVE    Comment: (NOTE) The Xpert SA Assay (FDA approved for NASAL specimens in patients 57 years of age and older), is one component of a comprehensive surveillance program. It is not intended to diagnose infection nor to guide or monitor treatment. Performed at [facility], [place] [address] [place], [place] [address]    No results found.    Blood pressure 140/66, pulse (!) 56, temperature 97.7 F (36.5 C), temperature source Oral, resp. rate 18, height 5\' 10"  (1.778 m), weight 87.5 kg, SpO2 99 %.  Awake and alert.  Oriented and appropriate.  Cranial nerve function intact.  Motor and sensory  function of the extremities reveals weakness of his anterior tibialis muscle grading out at 4/5 he has weakness of his extensor houses longus grading out at 4/5 on the right.  Sensor examination decreased sensation pinprick light touch in his right L5 dermatome.  Straight raising positive on the right equivocal on the left peer examination head ears eyes nose throat some are clear chest and abdomen are benign.  Extremities are free of major deformity. Assessment/Plan L4-5 stenosis with associated synovial cyst.  Plan bilateral L4-5 decompressive laminotomies and foraminotomies with resection of synovial cyst.  Risks and benefits been explained.  Patient wishes to proceed.  Mallie Mussel A Perkins Molina 11/29/2019, 7:56 AM

## 2019-11-29 NOTE — Discharge Instructions (Signed)

## 2019-11-29 NOTE — Progress Notes (Signed)
Patient is discharged from room 3C05 at this time. Alert and in stable condition. IV site d/c'd and instructions read to patient with understanding verbalized and all questions answered. Left unit via wheelchair with all belongings at side.  

## 2019-11-29 NOTE — Brief Op Note (Signed)
11/29/2019  9:32 AM  PATIENT:  Larry Roberts  79 y.o. male  PRE-OPERATIVE DIAGNOSIS:  Synovial cyst  POST-OPERATIVE DIAGNOSIS:  Synovial cyst  PROCEDURE:  Procedure(s): Laminectomy for facet/synovial cyst - bilateral - Lumbar four-Lumbar five (Bilateral)  SURGEON:  Surgeon(s) and Role:    Earnie Larsson, MD - Primary  PHYSICIAN ASSISTANT:   ASSISTANTSMearl Latin   ANESTHESIA:   general  EBL:  50 mL   BLOOD ADMINISTERED:none  DRAINS: none   LOCAL MEDICATIONS USED:  MARCAINE     SPECIMEN:  No Specimen  DISPOSITION OF SPECIMEN:  N/A  COUNTS:  YES  TOURNIQUET:  * No tourniquets in log *  DICTATION: .Dragon Dictation  PLAN OF CARE: Admit for overnight observation  PATIENT DISPOSITION:  PACU - hemodynamically stable.   Delay start of Pharmacological VTE agent (>24hrs) due to surgical blood loss or risk of bleeding: yes

## 2019-11-29 NOTE — Anesthesia Procedure Notes (Signed)
Procedure Name: Intubation Date/Time: 11/29/2019 8:14 AM Performed by: Moshe Salisbury, CRNA Pre-anesthesia Checklist: Patient identified, Emergency Drugs available, Suction available and Patient being monitored Patient Re-evaluated:Patient Re-evaluated prior to induction Oxygen Delivery Method: Circle System Utilized Preoxygenation: Pre-oxygenation with 100% oxygen Induction Type: IV induction Ventilation: Mask ventilation without difficulty Laryngoscope Size: Mac and 4 Grade View: Grade III Tube type: Oral Tube size: 8.0 mm Number of attempts: 1 Airway Equipment and Method: Stylet Placement Confirmation: ETT inserted through vocal cords under direct vision,  positive ETCO2 and breath sounds checked- equal and bilateral Secured at: 22 cm Tube secured with: Tape Dental Injury: Teeth and Oropharynx as per pre-operative assessment

## 2019-11-29 NOTE — Transfer of Care (Signed)
Immediate Anesthesia Transfer of Care Note  Patient: Larry Roberts  Procedure(s) Performed: Laminectomy for facet/synovial cyst - bilateral - Lumbar four-Lumbar five (Bilateral Back)  Patient Location: PACU  Anesthesia Type:General  Level of Consciousness: awake and patient cooperative  Airway & Oxygen Therapy: Patient Spontanous Breathing and Patient connected to nasal cannula oxygen  Post-op Assessment: Report given to RN, Post -op Vital signs reviewed and stable and Patient moving all extremities  Post vital signs: Reviewed and stable  Last Vitals:  Vitals Value Taken Time  BP    Temp    Pulse 50 11/29/19 0946  Resp 20 11/29/19 0946  SpO2 100 % 11/29/19 0946  Vitals shown include unvalidated device data.  Last Pain:  Vitals:   11/29/19 0711  TempSrc:   PainSc: 0-No pain         Complications: No complications documented.

## 2020-05-19 ENCOUNTER — Other Ambulatory Visit: Payer: Self-pay | Admitting: Cardiology

## 2020-05-19 DIAGNOSIS — R9439 Abnormal result of other cardiovascular function study: Secondary | ICD-10-CM

## 2020-05-21 ENCOUNTER — Telehealth: Payer: Self-pay | Admitting: Cardiology

## 2020-05-21 NOTE — Telephone Encounter (Signed)
*  STAT* If patient is at the pharmacy, call can be transferred to refill team.   1. Which medications need to be refilled? (please list name of each medication and dose if known) metoprolol ER 25 mg  2. Which pharmacy/location (including street and city if local pharmacy) is medication to be sent to? [facility]  3. Do they need a 30 day or 90 day supply? [place]

## 2020-06-09 NOTE — Progress Notes (Signed)
HPI: FU CAD.Nuclear study March 2020 significantly abnormal. Cardiac catheterization March 2020 showed a 99% proximal LAD, 90% ostial first diagonal, 70% apical LAD, 60% circumflex and 60% right coronary artery. Carotid Dopplers March 2020 showed 1 to 39% bilateral stenosis. EchoMarch 2020 showed normal LV function, mild diastolic dysfunction. Patient subsequently underwent coronary artery bypass and graft with a LIMA to the LAD and saphenous vein graft to the second diagonal. Abdominal ultrasound June 2020 showed no aneurysm.  Since last seen the patient denies any dyspnea on exertion, orthopnea, PND, pedal edema, palpitations, syncope or chest pain.   Current Outpatient Medications  Medication Sig Dispense Refill  . acetaminophen (TYLENOL) 500 MG tablet Take 500 mg by mouth daily.    Marland Kitchen ALPRAZolam (XANAX) 0.5 MG tablet Take 0.25 mg by mouth 2 (two) times daily as needed for anxiety or sleep.     Marland Kitchen amLODipine (NORVASC) 5 MG tablet Take 5 mg by mouth daily.  0  . aspirin EC 81 MG tablet Take 81 mg by mouth daily.    . carboxymethylcellulose (REFRESH PLUS) 0.5 % SOLN Place 1 drop into both eyes daily as needed (dry/irritated eyes.).     Marland Kitchen cyclobenzaprine (FLEXERIL) 10 MG tablet Take 1 tablet (10 mg total) by mouth 3 (three) times daily as needed for muscle spasms. 30 tablet 0  . dorzolamide-timolol (COSOPT) 22.3-6.8 MG/ML ophthalmic solution Place 1 drop into both eyes 2 (two) times daily.   6  . famotidine (PEPCID) 20 MG tablet Take 20 mg by mouth daily as needed for heartburn.    Marland Kitchen HYDROcodone-acetaminophen (NORCO/VICODIN) 5-325 MG tablet Take 1 tablet by mouth every 4 (four) hours as needed for moderate pain ((score 4 to 6)). 30 tablet 0  . latanoprost (XALATAN) 0.005 % ophthalmic solution Place 1 drop into both eyes at bedtime.   2  . metoprolol succinate (TOPROL-XL) 25 MG 24 hr tablet Take 1 tablet by mouth once daily 90 tablet 1  . Misc Natural Products (OSTEO BI-FLEX JOINT  SHIELD) TABS Take 1 tablet by mouth daily.     . rosuvastatin (CRESTOR) 40 MG tablet TAKE 1 TABLET BY MOUTH AT BEDTIME (Patient taking differently: Take 40 mg by mouth at bedtime.) 90 tablet 3  . tamsulosin (FLOMAX) 0.4 MG CAPS capsule TAKE 1 CAPSULE BY MOUTH ONCE DAILY AFTER SUPPER (Patient taking differently: Take 0.4 mg by mouth daily as needed (urine flow).) 30 capsule 0   No current facility-administered medications for this visit.     Past Medical History:  Diagnosis Date  . Anxiety   . Arthritis   . Carpal tunnel syndrome of left wrist   . Coronary artery disease   . GERD (gastroesophageal reflux disease)   . Glaucoma, both eyes   . Hx of colonic polyps   . Hypertension   . Nocturia   . Prostate cancer [facility]) urologist-  dr borden/  oncologist-- dr Tammi Klippel   dx 08/ 2018 Gleason 3+3, PSA 6.8 active surveillane/  dx 09-15-2017 Stage T2a, Gleason 3+4, PSA 3.74-- plan external beam radiation and ADT    Past Surgical History:  Procedure Laterality Date  . CARPAL TUNNEL RELEASE Right 06-30-2007   dr sypher   w/  A-1 pulley release right index and long fingers  . CATARACT EXTRACTION W/ INTRAOCULAR LENS  IMPLANT, BILATERAL  2012 approx.  . COLONOSCOPY W/ POLYPECTOMY    . CORONARY ARTERY BYPASS GRAFT N/A 09/01/2018   Procedure: CORONARY ARTERY BYPASS GRAFTING (CABG)  x two,  using left internal mammary and right saphenous vein (endoscopic vein harvest).;  Surgeon: Alleen Borne, MD;  Location: [facility] OR;  Service: Open Heart Surgery;  Laterality: N/A;  . GOLD SEED IMPLANT N/A 11/14/2017   Procedure: GOLD SEED IMPLANT;  Surgeon: Heloise Purpura, MD;  Location: [facility];  Service: Urology;  Laterality: N/A;  Needs Ultrasound Tech  . LAPAROSCOPY CECECTOMY W/ APPENDECTOMY  05-20-2009  dr Jamey Ripa   cecal polyp at appendiceal base  . LEFT HEART CATH AND CORONARY ANGIOGRAPHY N/A 08/30/2018   Procedure: LEFT HEART CATH AND CORONARY ANGIOGRAPHY;  Surgeon: Lyn Records, MD;   Location: [facility] INV[facility]IVE CV LAB;  Service: Cardiovascular;  Laterality: N/A;  . LUMBAR LAMINECTOMY/DECOMPRESSION MICRODISCECTOMY Bilateral 11/29/2019   Procedure: Laminectomy for facet/synovial cyst - bilateral - Lumbar four-Lumbar five;  Surgeon: Julio Sicks, MD;  Location: [facility] OR;  Service: Neurosurgery;  Laterality: Bilateral;  . PROSTATE BIOPSY  09-15-2017;  08/ 2018  . SHOULDER ARTHROSCOPY DISTAL CLAVICLE EXCISION AND OPEN ROTATOR CUFF REPAIR Right 08-06-2008   dr sypher   w/ debridement, SCD, bursectomy, acrominoplasty and Biceps tenodesis  . SHOULDER ARTHROSCOPY WITH OPEN ROTATOR CUFF REPAIR Left 2018   "and tendon repair's"  . SPACE OAR INSTILLATION N/A 11/14/2017   Procedure: SPACE OAR INSTILLATION;  Surgeon: Heloise Purpura, MD;  Location: [facility];  Service: Urology;  Laterality: N/A;  . TEE WITHOUT CARDIOVERSION N/A 09/01/2018   Procedure: TRANSESOPHAGEAL ECHOCARDIOGRAM (TEE);  Surgeon: Alleen Borne, MD;  Location: [facility] OR;  Service: Open Heart Surgery;  Laterality: N/A;    Social History   Socioeconomic History  . Marital status: Married    Spouse name: Darel Hong  . Number of children: 2  . Years of education: Not on file  . Highest education level: Not on file  Occupational History  . Occupation: retired  Tobacco Use  . Smoking status: Former Smoker    Years: 25.00    Types: Cigarettes    Quit date: 11/11/1986    Years since quitting: 33.6  . Smokeless tobacco: Former Neurosurgeon    Types: Chew    Quit date: 11/10/1988  Vaping Use  . Vaping Use: Never used  Substance and Sexual Activity  . Alcohol use: Yes    Alcohol/week: 3.0 standard drinks    Types: 3 Cans of beer per week  . Drug use: Never  . Sexual activity: Yes  Other Topics Concern  . Not on file  Social History Narrative  . Not on file   Social Determinants of Health   Financial Resource Strain: Not on file  Food Insecurity: Not on file  Transportation Needs: Not on file  Physical Activity: Not on  file  Stress: Not on file  Social Connections: Not on file  Intimate Partner Violence: Not on file    Family History  Problem Relation Age of Onset  . Prostate cancer Brother   . Hodgkin's lymphoma Daughter   . Breast cancer Daughter   . Melanoma Daughter   . Throat cancer Father   . Breast cancer Sister   . Breast cancer Sister   . Thyroid cancer Sister     ROS: no fevers or chills, productive cough, hemoptysis, dysphasia, odynophagia, melena, hematochezia, dysuria, hematuria, rash, seizure activity, orthopnea, PND, pedal edema, claudication. Remaining systems are negative.  Physical Exam: Well-developed well-nourished in no acute distress.  Skin is warm and dry.  HEENT is normal.  Neck is supple.  Chest is clear to auscultation with normal expansion.  Cardiovascular exam is regular rate and rhythm.  Abdominal exam nontender or distended. No masses palpated. Extremities show no edema. neuro grossly intact  ECG-normal sinus rhythm, left anterior fascicular block.  Personally reviewed  A/P  1 coronary artery disease status post coronary artery bypass graft-plan to continue medical therapy with aspirin and statin.  2 hypertension-patient's blood pressure is controlled.  Continue present medical regimen and follow.  Check potassium and renal function.  3 hyperlipidemia-continue statin.  Check lipids and liver.  Kirk Ruths, MD

## 2020-06-16 ENCOUNTER — Ambulatory Visit: Payer: Medicare Other | Admitting: Cardiology

## 2020-06-16 ENCOUNTER — Encounter: Payer: Self-pay | Admitting: Cardiology

## 2020-06-16 ENCOUNTER — Other Ambulatory Visit: Payer: Self-pay

## 2020-06-16 ENCOUNTER — Encounter: Payer: Self-pay | Admitting: *Deleted

## 2020-06-16 VITALS — BP 140/78 | HR 60 | Ht 70.0 in | Wt 197.0 lb

## 2020-06-16 DIAGNOSIS — I10 Essential (primary) hypertension: Secondary | ICD-10-CM | POA: Diagnosis not present

## 2020-06-16 DIAGNOSIS — I2510 Atherosclerotic heart disease of native coronary artery without angina pectoris: Secondary | ICD-10-CM

## 2020-06-16 DIAGNOSIS — E7800 Pure hypercholesterolemia, unspecified: Secondary | ICD-10-CM | POA: Diagnosis not present

## 2020-06-16 LAB — COMPREHENSIVE METABOLIC PANEL
ALT: 27 IU/L (ref 0–44)
AST: 30 IU/L (ref 0–40)
Albumin/Globulin Ratio: 2.2 (ref 1.2–2.2)
Albumin: 4.8 g/dL — ABNORMAL HIGH (ref 3.7–4.7)
Alkaline Phosphatase: 81 IU/L (ref 44–121)
BUN/Creatinine Ratio: 11 (ref 10–24)
BUN: 12 mg/dL (ref 8–27)
Bilirubin Total: 0.9 mg/dL (ref 0.0–1.2)
CO2: 23 mmol/L (ref 20–29)
Calcium: 9.9 mg/dL (ref 8.6–10.2)
Chloride: 106 mmol/L (ref 96–106)
Creatinine, Ser: 1.11 mg/dL (ref 0.76–1.27)
GFR calc Af Amer: 73 mL/min/{1.73_m2} (ref >59)
GFR calc non Af Amer: 63 mL/min/{1.73_m2} (ref >59)
Globulin, Total: 2.2 g/dL (ref 1.5–4.5)
Glucose: 105 mg/dL — ABNORMAL HIGH (ref 65–99)
Potassium: 4.5 mmol/L (ref 3.5–5.2)
Sodium: 141 mmol/L (ref 134–144)
Total Protein: 7 g/dL (ref 6.0–8.5)

## 2020-06-16 LAB — LIPID PANEL
Chol/HDL Ratio: 2.6 ratio (ref 0.0–5.0)
Cholesterol, Total: 108 mg/dL (ref 100–199)
HDL: 42 mg/dL (ref >39)
LDL Chol Calc (NIH): 49 mg/dL (ref 0–99)
Triglycerides: 84 mg/dL (ref 0–149)
VLDL Cholesterol Cal: 17 mg/dL (ref 5–40)

## 2020-06-16 NOTE — Patient Instructions (Signed)
Medication Instructions:   NO CHANGE  *If you need a refill on your cardiac medications before your next appointment, please call your pharmacy*   Lab Work:  Your physician recommends that you HAVE LAB WORK TODAY  If you have labs (blood work) drawn today and your tests are completely normal, you will receive your results only by: . MyChart Message (if you have MyChart) OR . A paper copy in the mail If you have any lab test that is abnormal or we need to change your treatment, we will call you to review the results.   Follow-Up: At [facility], you and your health needs are our priority.  As part of our continuing mission to provide you with exceptional heart care, we have created designated Provider Care Teams.  These Care Teams include your primary Cardiologist (physician) and Advanced Practice Providers (APPs -  Physician Assistants and Nurse Practitioners) who all work together to provide you with the care you need, when you need it.  We recommend signing up for the patient portal called "MyChart".  Sign up information is provided on this After Visit Summary.  MyChart is used to connect with patients for Virtual Visits (Telemedicine).  Patients are able to view lab/test results, encounter notes, upcoming appointments, etc.  Non-urgent messages can be sent to your provider as well.   To learn more about what you can do with MyChart, go to [email or web address].    Your next appointment:   12 month(s)  The format for your next appointment:   In Person  Provider:   Brian Crenshaw, MD     

## 2020-06-17 DIAGNOSIS — R059 Cough, unspecified: Secondary | ICD-10-CM | POA: Diagnosis not present

## 2020-07-30 DIAGNOSIS — R3912 Poor urinary stream: Secondary | ICD-10-CM | POA: Diagnosis not present

## 2020-08-15 ENCOUNTER — Other Ambulatory Visit: Payer: Self-pay | Admitting: Cardiology

## 2020-08-15 DIAGNOSIS — E7800 Pure hypercholesterolemia, unspecified: Secondary | ICD-10-CM

## 2020-09-30 DIAGNOSIS — K219 Gastro-esophageal reflux disease without esophagitis: Secondary | ICD-10-CM | POA: Diagnosis not present

## 2020-09-30 DIAGNOSIS — I25118 Atherosclerotic heart disease of native coronary artery with other forms of angina pectoris: Secondary | ICD-10-CM | POA: Diagnosis not present

## 2020-09-30 DIAGNOSIS — I10 Essential (primary) hypertension: Secondary | ICD-10-CM | POA: Diagnosis not present

## 2020-09-30 DIAGNOSIS — E782 Mixed hyperlipidemia: Secondary | ICD-10-CM | POA: Diagnosis not present

## 2020-10-07 DIAGNOSIS — H04123 Dry eye syndrome of bilateral lacrimal glands: Secondary | ICD-10-CM | POA: Diagnosis not present

## 2020-10-07 DIAGNOSIS — Z961 Presence of intraocular lens: Secondary | ICD-10-CM | POA: Diagnosis not present

## 2020-10-07 DIAGNOSIS — H401132 Primary open-angle glaucoma, bilateral, moderate stage: Secondary | ICD-10-CM | POA: Diagnosis not present

## 2020-10-07 DIAGNOSIS — H18413 Arcus senilis, bilateral: Secondary | ICD-10-CM | POA: Diagnosis not present

## 2020-11-10 ENCOUNTER — Other Ambulatory Visit: Payer: Self-pay | Admitting: Cardiology

## 2020-11-10 DIAGNOSIS — R9439 Abnormal result of other cardiovascular function study: Secondary | ICD-10-CM

## 2021-01-09 DIAGNOSIS — R300 Dysuria: Secondary | ICD-10-CM | POA: Diagnosis not present

## 2021-01-09 DIAGNOSIS — R350 Frequency of micturition: Secondary | ICD-10-CM | POA: Diagnosis not present

## 2021-01-09 DIAGNOSIS — R3915 Urgency of urination: Secondary | ICD-10-CM | POA: Diagnosis not present

## 2021-03-11 DIAGNOSIS — R350 Frequency of micturition: Secondary | ICD-10-CM | POA: Diagnosis not present

## 2021-04-07 DIAGNOSIS — Z0000 Encounter for general adult medical examination without abnormal findings: Secondary | ICD-10-CM | POA: Diagnosis not present

## 2021-04-07 DIAGNOSIS — I10 Essential (primary) hypertension: Secondary | ICD-10-CM | POA: Diagnosis not present

## 2021-04-07 DIAGNOSIS — Z23 Encounter for immunization: Secondary | ICD-10-CM | POA: Diagnosis not present

## 2021-04-07 DIAGNOSIS — Z1389 Encounter for screening for other disorder: Secondary | ICD-10-CM | POA: Diagnosis not present

## 2021-04-07 DIAGNOSIS — I700 Atherosclerosis of aorta: Secondary | ICD-10-CM | POA: Diagnosis not present

## 2021-04-07 DIAGNOSIS — R7309 Other abnormal glucose: Secondary | ICD-10-CM | POA: Diagnosis not present

## 2021-04-07 DIAGNOSIS — I25118 Atherosclerotic heart disease of native coronary artery with other forms of angina pectoris: Secondary | ICD-10-CM | POA: Diagnosis not present

## 2021-04-07 DIAGNOSIS — E782 Mixed hyperlipidemia: Secondary | ICD-10-CM | POA: Diagnosis not present

## 2021-04-07 DIAGNOSIS — M4696 Unspecified inflammatory spondylopathy, lumbar region: Secondary | ICD-10-CM | POA: Diagnosis not present

## 2021-04-14 DIAGNOSIS — H401132 Primary open-angle glaucoma, bilateral, moderate stage: Secondary | ICD-10-CM | POA: Diagnosis not present

## 2021-04-14 DIAGNOSIS — Z961 Presence of intraocular lens: Secondary | ICD-10-CM | POA: Diagnosis not present

## 2021-04-14 DIAGNOSIS — H18413 Arcus senilis, bilateral: Secondary | ICD-10-CM | POA: Diagnosis not present

## 2021-04-14 DIAGNOSIS — H04123 Dry eye syndrome of bilateral lacrimal glands: Secondary | ICD-10-CM | POA: Diagnosis not present

## 2021-07-01 NOTE — Progress Notes (Signed)
HPI:FU CAD.  Nuclear study March 2020 significantly abnormal.  Cardiac catheterization March 2020 showed a 99% proximal LAD, 90% ostial first diagonal, 70% apical LAD, 60% circumflex and 60% right coronary artery.  Carotid Dopplers March 2020 showed 1 to 39% bilateral stenosis. Echo March 2020 showed normal LV function, mild diastolic dysfunction.  Patient subsequently underwent coronary artery bypass and graft with a LIMA to the LAD and saphenous vein graft to the second diagonal. Abdominal ultrasound June 2020 showed no aneurysm.  Since last seen there is no exertional chest pain, dyspnea on exertion, syncope or pedal edema.  Current Outpatient Medications  Medication Sig Dispense Refill   acetaminophen (TYLENOL) 500 MG tablet Take 500 mg by mouth daily.     ALPRAZolam (XANAX) 0.5 MG tablet Take 0.25 mg by mouth 2 (two) times daily as needed for anxiety or sleep.      amLODipine (NORVASC) 5 MG tablet Take 5 mg by mouth daily.  0   aspirin EC 81 MG tablet Take 81 mg by mouth daily.     carboxymethylcellulose (REFRESH PLUS) 0.5 % SOLN Place 1 drop into both eyes daily as needed (dry/irritated eyes.).      dorzolamide-timolol (COSOPT) 22.3-6.8 MG/ML ophthalmic solution Place 1 drop into both eyes 2 (two) times daily.   6   HYDROcodone-acetaminophen (NORCO/VICODIN) 5-325 MG tablet Take 1 tablet by mouth every 4 (four) hours as needed for moderate pain ((score 4 to 6)). 30 tablet 0   latanoprost (XALATAN) 0.005 % ophthalmic solution Place 1 drop into both eyes at bedtime.   2   metoprolol succinate (TOPROL-XL) 25 MG 24 hr tablet Take 1 tablet (25 mg total) by mouth daily. 90 tablet 2   Misc Natural Products (OSTEO BI-FLEX JOINT SHIELD) TABS Take 1 tablet by mouth daily.      rosuvastatin (CRESTOR) 40 MG tablet TAKE 1 TABLET BY MOUTH AT BEDTIME 90 tablet 3   tamsulosin (FLOMAX) 0.4 MG CAPS capsule TAKE 1 CAPSULE BY MOUTH ONCE DAILY AFTER SUPPER (Patient taking differently: Take 0.4 mg by mouth  daily as needed (urine flow).) 30 capsule 0   cyclobenzaprine (FLEXERIL) 10 MG tablet Take 1 tablet (10 mg total) by mouth 3 (three) times daily as needed for muscle spasms. (Patient not taking: Reported on 07/14/2021) 30 tablet 0   famotidine (PEPCID) 20 MG tablet Take 20 mg by mouth daily as needed for heartburn. (Patient not taking: Reported on 07/14/2021)     No current facility-administered medications for this visit.     Past Medical History:  Diagnosis Date   Anxiety    Arthritis    Carpal tunnel syndrome of left wrist    Coronary artery disease    GERD (gastroesophageal reflux disease)    Glaucoma, both eyes    Hx of colonic polyps    Hypertension    Nocturia    Prostate cancer [facility]) urologist-  dr borden/  oncologist-- dr Tammi Klippel   dx 08/ 2018 Gleason 3+3, PSA 6.8 active surveillane/  dx 09-15-2017 Stage T2a, Gleason 3+4, PSA 3.74-- plan external beam radiation and ADT    Past Surgical History:  Procedure Laterality Date   CARPAL TUNNEL RELEASE Right 06-30-2007   dr sypher   w/  A-1 pulley release right index and long fingers   CATARACT EXTRACTION W/ INTRAOCULAR LENS  IMPLANT, BILATERAL  2012 approx.   COLONOSCOPY W/ POLYPECTOMY     CORONARY ARTERY BYPASS GRAFT N/A 09/01/2018   Procedure: CORONARY ARTERY BYPASS  GRAFTING (CABG)  x two, using left internal mammary and right saphenous vein (endoscopic vein harvest).;  Surgeon: Gaye Pollack, MD;  Location: [facility] OR;  Service: Open Heart Surgery;  Laterality: N/A;   GOLD SEED IMPLANT N/A 11/14/2017   Procedure: GOLD SEED IMPLANT;  Surgeon: Raynelle Bring, MD;  Location: [facility];  Service: Urology;  Laterality: N/A;  Needs Ultrasound Tech   LAPAROSCOPY CECECTOMY W/ APPENDECTOMY  05-20-2009  dr Margot Chimes   cecal polyp at appendiceal base   LEFT HEART CATH AND CORONARY ANGIOGRAPHY N/A 08/30/2018   Procedure: LEFT HEART CATH AND CORONARY ANGIOGRAPHY;  Surgeon: Belva Crome, MD;  Location: [place] CV LAB;  Service:  Cardiovascular;  Laterality: N/A;   LUMBAR LAMINECTOMY/DECOMPRESSION MICRODISCECTOMY Bilateral 11/29/2019   Procedure: Laminectomy for facet/synovial cyst - bilateral - Lumbar four-Lumbar five;  Surgeon: Earnie Larsson, MD;  Location: [place];  Service: Neurosurgery;  Laterality: Bilateral;   PROSTATE BIOPSY  09-15-2017;  08/ 2018   SHOULDER ARTHROSCOPY DISTAL CLAVICLE EXCISION AND OPEN ROTATOR CUFF REPAIR Right 08-06-2008   dr sypher   w/ debridement, SCD, bursectomy, acrominoplasty and Biceps tenodesis   SHOULDER ARTHROSCOPY WITH OPEN ROTATOR CUFF REPAIR Left 2018   "and tendon repair's"   SPACE OAR INSTILLATION N/A 11/14/2017   Procedure: SPACE OAR INSTILLATION;  Surgeon: Raynelle Bring, MD;  Location: [facility];  Service: Urology;  Laterality: N/A;   TEE WITHOUT CARDIOVERSION N/A 09/01/2018   Procedure: TRANSESOPHAGEAL ECHOCARDIOGRAM (TEE);  Surgeon: Gaye Pollack, MD;  Location: [place];  Service: Open Heart Surgery;  Laterality: N/A;    Social History   Socioeconomic History   Marital status: Married    Spouse name: Bethena Roys   Number of children: 2   Years of education: Not on file   Highest education level: Not on file  Occupational History   Occupation: retired  Tobacco Use   Smoking status: Former    Years: 25.00    Types: Cigarettes    Quit date: 11/11/1986    Years since quitting: 34.6   Smokeless tobacco: Former    Types: Chew    Quit date: 11/10/1988  Vaping Use   Vaping Use: Never used  Substance and Sexual Activity   Alcohol use: Yes    Alcohol/week: 3.0 standard drinks    Types: 3 Cans of beer per week   Drug use: Never   Sexual activity: Yes  Other Topics Concern   Not on file  Social History Narrative   Not on file   Social Determinants of Health   Financial Resource Strain: Not on file  Food Insecurity: Not on file  Transportation Needs: Not on file  Physical Activity: Not on file  Stress: Not on file  Social Connections: Not on file   Intimate Partner Violence: Not on file    Family History  Problem Relation Age of Onset   Prostate cancer Brother    Hodgkin's lymphoma Daughter    Breast cancer Daughter    Melanoma Daughter    Throat cancer Father    Breast cancer Sister    Breast cancer Sister    Thyroid cancer Sister     ROS: Some low back pain but no fevers or chills, productive cough, hemoptysis, dysphasia, odynophagia, melena, hematochezia, dysuria, hematuria, rash, seizure activity, orthopnea, PND, pedal edema, claudication. Remaining systems are negative.  Physical Exam: Well-developed well-nourished in no acute distress.  Skin is warm and dry.  HEENT is normal.  Neck is supple.  Chest is clear to auscultation with normal expansion.  Cardiovascular exam is regular rate and rhythm.  Abdominal exam nontender or distended. No masses palpated. Extremities show no edema. neuro grossly intact  ECG-normal sinus rhythm at a rate of 64, left anterior fascicular block, nonspecific ST changes.  Personally reviewed  A/P  1 coronary artery disease status post coronary artery bypass graft-patient doing well with no chest pain.  Continue medical therapy with aspirin and statin.  2 hypertension-blood pressure controlled.  Continue present medications.    3 hyperlipidemia-continue statin.  Will have his most recent lipids and liver forwarded to [place] from primary care.  Kirk Ruths, MD

## 2021-07-07 DIAGNOSIS — L821 Other seborrheic keratosis: Secondary | ICD-10-CM | POA: Diagnosis not present

## 2021-07-07 DIAGNOSIS — L570 Actinic keratosis: Secondary | ICD-10-CM | POA: Diagnosis not present

## 2021-07-07 DIAGNOSIS — L820 Inflamed seborrheic keratosis: Secondary | ICD-10-CM | POA: Diagnosis not present

## 2021-07-07 DIAGNOSIS — X32XXXD Exposure to sunlight, subsequent encounter: Secondary | ICD-10-CM | POA: Diagnosis not present

## 2021-07-14 ENCOUNTER — Ambulatory Visit: Payer: Medicare Other | Admitting: Cardiology

## 2021-07-14 ENCOUNTER — Encounter: Payer: Self-pay | Admitting: Cardiology

## 2021-07-14 ENCOUNTER — Other Ambulatory Visit: Payer: Self-pay

## 2021-07-14 VITALS — BP 130/62 | HR 64 | Ht 70.0 in | Wt 192.4 lb

## 2021-07-14 DIAGNOSIS — I10 Essential (primary) hypertension: Secondary | ICD-10-CM | POA: Diagnosis not present

## 2021-07-14 DIAGNOSIS — E7800 Pure hypercholesterolemia, unspecified: Secondary | ICD-10-CM | POA: Diagnosis not present

## 2021-07-14 DIAGNOSIS — I2510 Atherosclerotic heart disease of native coronary artery without angina pectoris: Secondary | ICD-10-CM | POA: Diagnosis not present

## 2021-07-14 NOTE — Patient Instructions (Signed)

## 2021-08-06 ENCOUNTER — Other Ambulatory Visit: Payer: Self-pay | Admitting: Cardiology

## 2021-08-06 DIAGNOSIS — E7800 Pure hypercholesterolemia, unspecified: Secondary | ICD-10-CM

## 2021-08-17 ENCOUNTER — Other Ambulatory Visit: Payer: Self-pay | Admitting: Cardiology

## 2021-08-17 DIAGNOSIS — R9439 Abnormal result of other cardiovascular function study: Secondary | ICD-10-CM

## 2021-09-14 IMAGING — CR DG LUMBAR SPINE 1V
1 series · 1 of 1 positions shown · non-contrast
Comparison: 10/04/2019

CLINICAL DATA: Intraoperative localization

EXAM:
LUMBAR SPINE - 1 VIEW

[lateral]
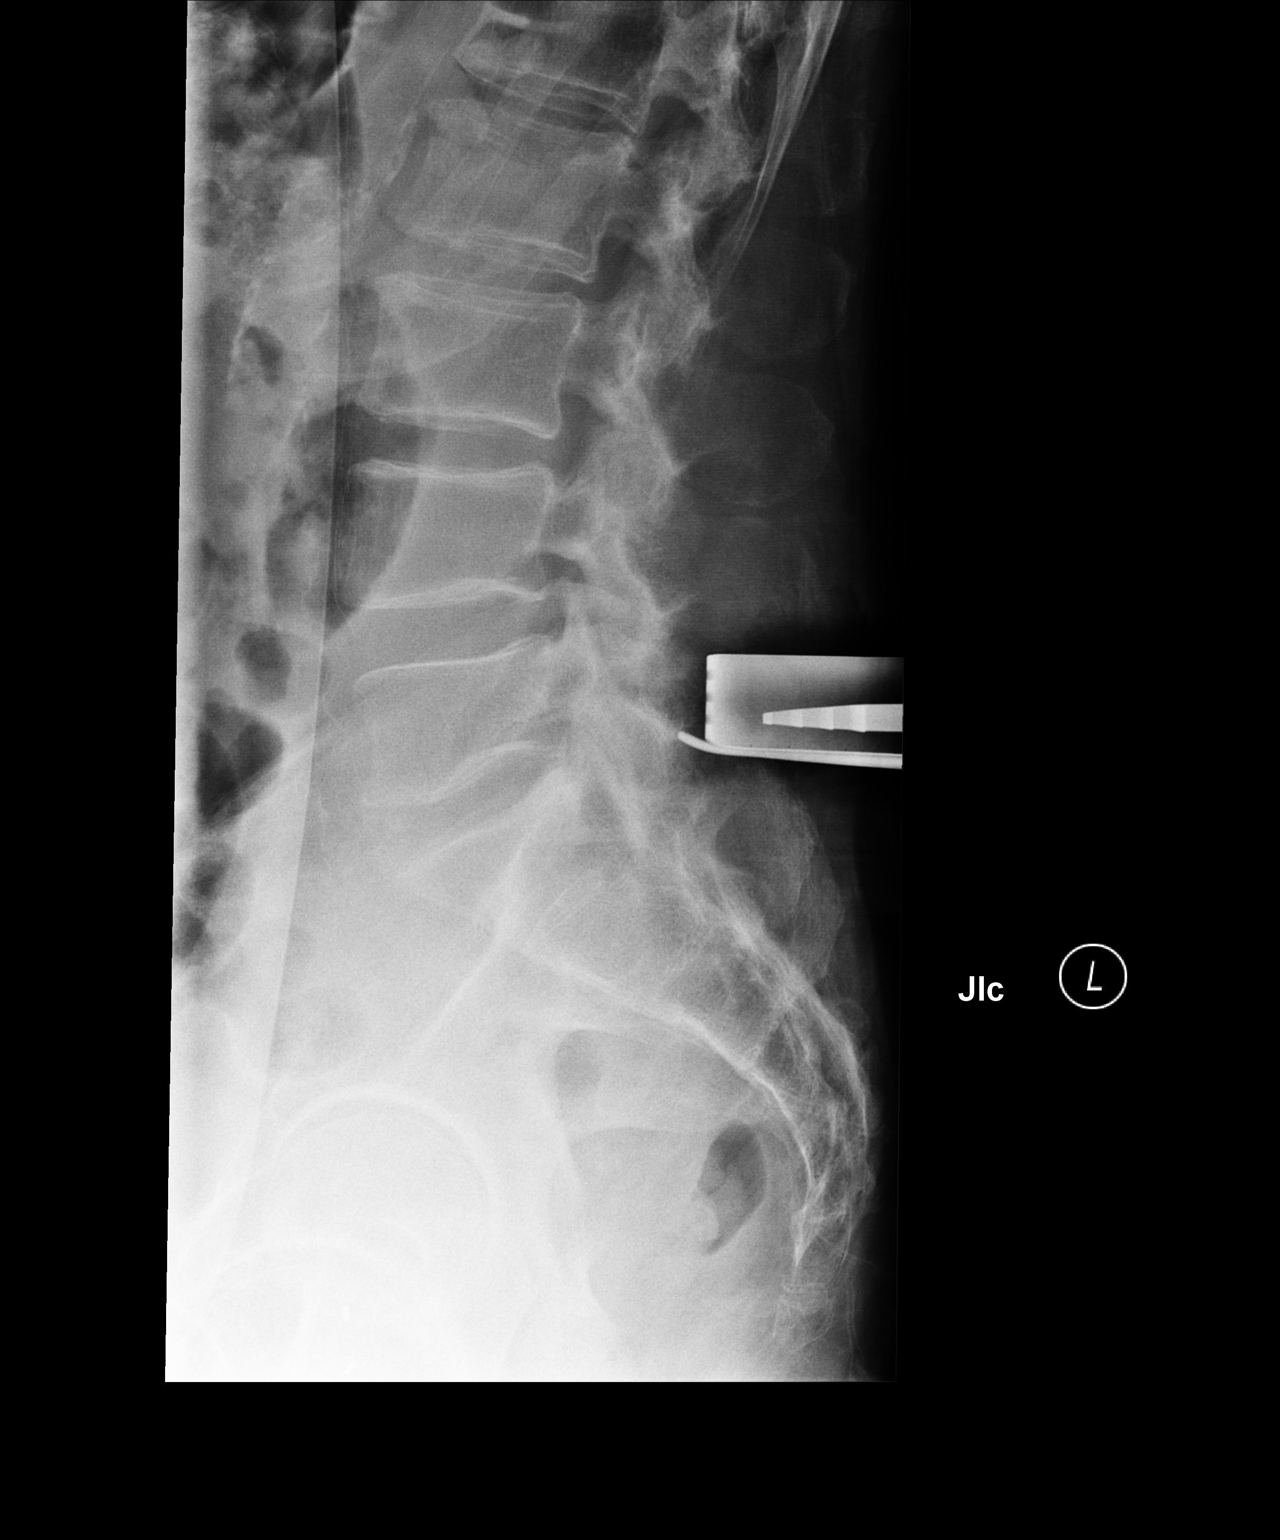

[1 of 1 positions shown; findings below may reference images not displayed]

FINDINGS: Surgical retractors and instruments are noted posterior to the L5-S1
level. Numbering nomenclature is similar to that utilized on prior
MRI
IMPRESSION: Intraoperative localization at L5-S1.

## 2021-10-05 DIAGNOSIS — Z951 Presence of aortocoronary bypass graft: Secondary | ICD-10-CM | POA: Diagnosis not present

## 2021-10-05 DIAGNOSIS — E782 Mixed hyperlipidemia: Secondary | ICD-10-CM | POA: Diagnosis not present

## 2021-10-05 DIAGNOSIS — I25118 Atherosclerotic heart disease of native coronary artery with other forms of angina pectoris: Secondary | ICD-10-CM | POA: Diagnosis not present

## 2021-10-05 DIAGNOSIS — I10 Essential (primary) hypertension: Secondary | ICD-10-CM | POA: Diagnosis not present

## 2021-10-13 DIAGNOSIS — H18413 Arcus senilis, bilateral: Secondary | ICD-10-CM | POA: Diagnosis not present

## 2021-10-13 DIAGNOSIS — H401132 Primary open-angle glaucoma, bilateral, moderate stage: Secondary | ICD-10-CM | POA: Diagnosis not present

## 2021-10-13 DIAGNOSIS — Z961 Presence of intraocular lens: Secondary | ICD-10-CM | POA: Diagnosis not present

## 2021-10-13 DIAGNOSIS — H04123 Dry eye syndrome of bilateral lacrimal glands: Secondary | ICD-10-CM | POA: Diagnosis not present

## 2021-10-23 DIAGNOSIS — R3915 Urgency of urination: Secondary | ICD-10-CM | POA: Diagnosis not present

## 2021-11-07 ENCOUNTER — Other Ambulatory Visit: Payer: Self-pay | Admitting: Cardiology

## 2021-11-07 DIAGNOSIS — R9439 Abnormal result of other cardiovascular function study: Secondary | ICD-10-CM

## 2022-01-21 DIAGNOSIS — M546 Pain in thoracic spine: Secondary | ICD-10-CM | POA: Diagnosis not present

## 2022-01-26 DIAGNOSIS — M546 Pain in thoracic spine: Secondary | ICD-10-CM | POA: Diagnosis not present

## 2022-01-27 DIAGNOSIS — M546 Pain in thoracic spine: Secondary | ICD-10-CM | POA: Diagnosis not present

## 2022-04-15 DIAGNOSIS — R7309 Other abnormal glucose: Secondary | ICD-10-CM | POA: Diagnosis not present

## 2022-04-15 DIAGNOSIS — R519 Headache, unspecified: Secondary | ICD-10-CM | POA: Diagnosis not present

## 2022-04-15 DIAGNOSIS — Z23 Encounter for immunization: Secondary | ICD-10-CM | POA: Diagnosis not present

## 2022-04-15 DIAGNOSIS — H409 Unspecified glaucoma: Secondary | ICD-10-CM | POA: Diagnosis not present

## 2022-04-15 DIAGNOSIS — M4696 Unspecified inflammatory spondylopathy, lumbar region: Secondary | ICD-10-CM | POA: Diagnosis not present

## 2022-04-15 DIAGNOSIS — E782 Mixed hyperlipidemia: Secondary | ICD-10-CM | POA: Diagnosis not present

## 2022-04-15 DIAGNOSIS — J309 Allergic rhinitis, unspecified: Secondary | ICD-10-CM | POA: Diagnosis not present

## 2022-04-15 DIAGNOSIS — I700 Atherosclerosis of aorta: Secondary | ICD-10-CM | POA: Diagnosis not present

## 2022-04-15 DIAGNOSIS — I10 Essential (primary) hypertension: Secondary | ICD-10-CM | POA: Diagnosis not present

## 2022-04-15 DIAGNOSIS — Z0000 Encounter for general adult medical examination without abnormal findings: Secondary | ICD-10-CM | POA: Diagnosis not present

## 2022-04-15 DIAGNOSIS — I25118 Atherosclerotic heart disease of native coronary artery with other forms of angina pectoris: Secondary | ICD-10-CM | POA: Diagnosis not present

## 2022-04-20 DIAGNOSIS — H04123 Dry eye syndrome of bilateral lacrimal glands: Secondary | ICD-10-CM | POA: Diagnosis not present

## 2022-04-20 DIAGNOSIS — H401132 Primary open-angle glaucoma, bilateral, moderate stage: Secondary | ICD-10-CM | POA: Diagnosis not present

## 2022-04-20 DIAGNOSIS — Z961 Presence of intraocular lens: Secondary | ICD-10-CM | POA: Diagnosis not present

## 2022-04-20 DIAGNOSIS — H353131 Nonexudative age-related macular degeneration, bilateral, early dry stage: Secondary | ICD-10-CM | POA: Diagnosis not present

## 2022-05-13 DIAGNOSIS — D696 Thrombocytopenia, unspecified: Secondary | ICD-10-CM | POA: Diagnosis not present

## 2022-09-02 NOTE — Progress Notes (Signed)
HPI: FU CAD.  Nuclear study March 2020 significantly abnormal.  Cardiac catheterization March 2020 showed a 99% proximal LAD, 90% ostial first diagonal, 70% apical LAD, 60% circumflex and 60% right coronary artery.  Carotid Dopplers March 2020 showed 1 to 39% bilateral stenosis. Echo March 2020 showed normal LV function, mild diastolic dysfunction.  Patient subsequently underwent coronary artery bypass and graft with a LIMA to the LAD and saphenous vein graft to the second diagonal. Abdominal ultrasound June 2020 showed no aneurysm.  Since last seen the patient denies any dyspnea on exertion, orthopnea, PND, pedal edema, palpitations, syncope or chest pain.   Current Outpatient Medications  Medication Sig Dispense Refill   acetaminophen (TYLENOL) 500 MG tablet Take 500 mg by mouth daily.     ALPRAZolam (XANAX) 0.5 MG tablet Take 0.25 mg by mouth 2 (two) times daily as needed for anxiety or sleep.      amLODipine (NORVASC) 5 MG tablet Take 5 mg by mouth daily.  0   aspirin EC 81 MG tablet Take 81 mg by mouth daily.     carboxymethylcellulose (REFRESH PLUS) 0.5 % SOLN Place 1 drop into both eyes daily as needed (dry/irritated eyes.).      cyclobenzaprine (FLEXERIL) 10 MG tablet Take 1 tablet (10 mg total) by mouth 3 (three) times daily as needed for muscle spasms. 30 tablet 0   dorzolamide-timolol (COSOPT) 22.3-6.8 MG/ML ophthalmic solution Place 1 drop into both eyes 2 (two) times daily.   6   famotidine (PEPCID) 20 MG tablet Take 20 mg by mouth daily as needed for heartburn.     HYDROcodone-acetaminophen (NORCO/VICODIN) 5-325 MG tablet Take 1 tablet by mouth every 4 (four) hours as needed for moderate pain ((score 4 to 6)). 30 tablet 0   latanoprost (XALATAN) 0.005 % ophthalmic solution Place 1 drop into both eyes at bedtime.   2   metoprolol succinate (TOPROL-XL) 25 MG 24 hr tablet Take 1 tablet by mouth once daily 90 tablet 3   Misc Natural Products (OSTEO BI-FLEX JOINT SHIELD) TABS Take 1  tablet by mouth daily.      rosuvastatin (CRESTOR) 40 MG tablet TAKE 1 TABLET BY MOUTH AT BEDTIME 90 tablet 3   tamsulosin (FLOMAX) 0.4 MG CAPS capsule TAKE 1 CAPSULE BY MOUTH ONCE DAILY AFTER SUPPER (Patient taking differently: Take 0.4 mg by mouth daily as needed (urine flow).) 30 capsule 0   No current facility-administered medications for this visit.     Past Medical History:  Diagnosis Date   Anxiety    Arthritis    Carpal tunnel syndrome of left wrist    Coronary artery disease    GERD (gastroesophageal reflux disease)    Glaucoma, both eyes    Hx of colonic polyps    Hypertension    Nocturia    Prostate cancer [facility]) urologist-  dr borden/  oncologist-- dr Tammi Klippel   dx 08/ 2018 Gleason 3+3, PSA 6.8 active surveillane/  dx 09-15-2017 Stage T2a, Gleason 3+4, PSA 3.74-- plan external beam radiation and ADT    Past Surgical History:  Procedure Laterality Date   CARPAL TUNNEL RELEASE Right 06-30-2007   dr sypher   w/  A-1 pulley release right index and long fingers   CATARACT EXTRACTION W/ INTRAOCULAR LENS  IMPLANT, BILATERAL  2012 approx.   COLONOSCOPY W/ POLYPECTOMY     CORONARY ARTERY BYPASS GRAFT N/A 09/01/2018   Procedure: CORONARY ARTERY BYPASS GRAFTING (CABG)  x two, using left internal mammary and  right saphenous vein (endoscopic vein harvest).;  Surgeon: Gaye Pollack, MD;  Location: [facility] OR;  Service: Open Heart Surgery;  Laterality: N/A;   GOLD SEED IMPLANT N/A 11/14/2017   Procedure: GOLD SEED IMPLANT;  Surgeon: Raynelle Bring, MD;  Location: [facility];  Service: Urology;  Laterality: N/A;  Needs Ultrasound Tech   LAPAROSCOPY CECECTOMY W/ APPENDECTOMY  05-20-2009  dr Margot Chimes   cecal polyp at appendiceal base   LEFT HEART CATH AND CORONARY ANGIOGRAPHY N/A 08/30/2018   Procedure: LEFT HEART CATH AND CORONARY ANGIOGRAPHY;  Surgeon: Belva Crome, MD;  Location: [place] [facility];  Service: Cardiovascular;  Laterality: N/A;   LUMBAR  LAMINECTOMY/DECOMPRESSION MICRODISCECTOMY Bilateral 11/29/2019   Procedure: Laminectomy for facet/synovial cyst - bilateral - Lumbar four-Lumbar five;  Surgeon: Earnie Larsson, MD;  Location: [place];  Service: Neurosurgery;  Laterality: Bilateral;   PROSTATE BIOPSY  09-15-2017;  08/ 2018   SHOULDER ARTHROSCOPY DISTAL CLAVICLE EXCISION AND OPEN ROTATOR CUFF REPAIR Right 08-06-2008   dr sypher   w/ debridement, SCD, bursectomy, acrominoplasty and Biceps tenodesis   SHOULDER ARTHROSCOPY WITH OPEN ROTATOR CUFF REPAIR Left 2018   "and tendon repair's"   SPACE OAR INSTILLATION N/A 11/14/2017   Procedure: SPACE OAR INSTILLATION;  Surgeon: Raynelle Bring, MD;  Location: [facility];  Service: Urology;  Laterality: N/A;   TEE WITHOUT CARDIOVERSION N/A 09/01/2018   Procedure: TRANSESOPHAGEAL ECHOCARDIOGRAM (TEE);  Surgeon: Gaye Pollack, MD;  Location: [place];  Service: Open Heart Surgery;  Laterality: N/A;    Social History   Socioeconomic History   Marital status: Married    Spouse name: Bethena Roys   Number of children: 2   Years of education: Not on file   Highest education level: Not on file  Occupational History   Occupation: retired  Tobacco Use   Smoking status: Former    Years: 25    Types: Cigarettes    Quit date: 11/11/1986    Years since quitting: 35.8   Smokeless tobacco: Former    Types: Chew    Quit date: 11/10/1988  Vaping Use   Vaping Use: Never used  Substance and Sexual Activity   Alcohol use: Yes    Alcohol/week: 3.0 standard drinks of alcohol    Types: 3 Cans of beer per week   Drug use: Never   Sexual activity: Yes  Other Topics Concern   Not on file  Social History Narrative   Not on file   Social Determinants of Health   Financial Resource Strain: Not on file  Food Insecurity: Not on file  Transportation Needs: Not on file  Physical Activity: Not on file  Stress: Not on file  Social Connections: Not on file  Intimate Partner Violence: Not on file     Family History  Problem Relation Age of Onset   Prostate cancer Brother    Hodgkin's lymphoma Daughter    Breast cancer Daughter    Melanoma Daughter    Throat cancer Father    Breast cancer Sister    Breast cancer Sister    Thyroid cancer Sister     ROS: no fevers or chills, productive cough, hemoptysis, dysphasia, odynophagia, melena, hematochezia, dysuria, hematuria, rash, seizure activity, orthopnea, PND, pedal edema, claudication. Remaining systems are negative.  Physical Exam: Well-developed well-nourished in no acute distress.  Skin is warm and dry.  HEENT is normal.  Neck is supple.  Chest is clear to auscultation with normal expansion.  Cardiovascular exam is regular  rate and rhythm.  Abdominal exam nontender or distended. No masses palpated. Extremities show no edema. neuro grossly intact  ECG-sinus bradycardia at a rate of 55, left anterior fascicular block, nonspecific ST changes.  Personally reviewed  A/P  1 coronary artery disease status post coronary artery bypass graft-Pt denies CP; continue medical therapy including aspirin and statin.   2 hypertension-BP controlled.  Continue present medical regimen.   3 hyperlipidemia-continue crestor.    Kirk Ruths, MD

## 2022-09-08 ENCOUNTER — Encounter: Payer: Self-pay | Admitting: Cardiology

## 2022-09-08 ENCOUNTER — Ambulatory Visit: Payer: Medicare Other | Attending: Cardiology | Admitting: Cardiology

## 2022-09-08 VITALS — BP 128/66 | HR 55 | Ht 70.0 in | Wt 194.8 lb

## 2022-09-08 DIAGNOSIS — I2510 Atherosclerotic heart disease of native coronary artery without angina pectoris: Secondary | ICD-10-CM | POA: Diagnosis not present

## 2022-09-08 DIAGNOSIS — I10 Essential (primary) hypertension: Secondary | ICD-10-CM | POA: Diagnosis not present

## 2022-09-08 DIAGNOSIS — E7800 Pure hypercholesterolemia, unspecified: Secondary | ICD-10-CM

## 2022-09-08 NOTE — Patient Instructions (Signed)
Medication Instructions:  NO CHANGES  *If you need a refill on your cardiac medications before your next appointment, please call your pharmacy*    Follow-Up: At [facility], you and your health needs are our priority.  As part of our continuing mission to provide you with exceptional heart care, we have created designated Provider Care Teams.  These Care Teams include your primary Cardiologist (physician) and Advanced Practice Providers (APPs -  Physician Assistants and Nurse Practitioners) who all work together to provide you with the care you need, when you need it.  We recommend signing up for the patient portal called "MyChart".  Sign up information is provided on this After Visit Summary.  MyChart is used to connect with patients for Virtual Visits (Telemedicine).  Patients are able to view lab/test results, encounter notes, upcoming appointments, etc.  Non-urgent messages can be sent to your provider as well.   To learn more about what you can do with MyChart, go to [email or web address].    Your next appointment:   12 month(s)  Provider:   Kirk Ruths, MD

## 2022-10-14 DIAGNOSIS — E782 Mixed hyperlipidemia: Secondary | ICD-10-CM | POA: Diagnosis not present

## 2022-10-14 DIAGNOSIS — I25118 Atherosclerotic heart disease of native coronary artery with other forms of angina pectoris: Secondary | ICD-10-CM | POA: Diagnosis not present

## 2022-10-14 DIAGNOSIS — I10 Essential (primary) hypertension: Secondary | ICD-10-CM | POA: Diagnosis not present

## 2022-10-14 DIAGNOSIS — I700 Atherosclerosis of aorta: Secondary | ICD-10-CM | POA: Diagnosis not present

## 2022-10-26 DIAGNOSIS — Z961 Presence of intraocular lens: Secondary | ICD-10-CM | POA: Diagnosis not present

## 2022-10-26 DIAGNOSIS — H353131 Nonexudative age-related macular degeneration, bilateral, early dry stage: Secondary | ICD-10-CM | POA: Diagnosis not present

## 2022-10-26 DIAGNOSIS — H04123 Dry eye syndrome of bilateral lacrimal glands: Secondary | ICD-10-CM | POA: Diagnosis not present

## 2022-10-26 DIAGNOSIS — H401132 Primary open-angle glaucoma, bilateral, moderate stage: Secondary | ICD-10-CM | POA: Diagnosis not present

## 2023-01-19 DIAGNOSIS — R3915 Urgency of urination: Secondary | ICD-10-CM | POA: Diagnosis not present

## 2023-02-16 DIAGNOSIS — X32XXXD Exposure to sunlight, subsequent encounter: Secondary | ICD-10-CM | POA: Diagnosis not present

## 2023-02-16 DIAGNOSIS — L570 Actinic keratosis: Secondary | ICD-10-CM | POA: Diagnosis not present

## 2023-04-19 DIAGNOSIS — Z23 Encounter for immunization: Secondary | ICD-10-CM | POA: Diagnosis not present

## 2023-04-19 DIAGNOSIS — E782 Mixed hyperlipidemia: Secondary | ICD-10-CM | POA: Diagnosis not present

## 2023-04-19 DIAGNOSIS — I10 Essential (primary) hypertension: Secondary | ICD-10-CM | POA: Diagnosis not present

## 2023-04-19 DIAGNOSIS — I700 Atherosclerosis of aorta: Secondary | ICD-10-CM | POA: Diagnosis not present

## 2023-04-19 DIAGNOSIS — H409 Unspecified glaucoma: Secondary | ICD-10-CM | POA: Diagnosis not present

## 2023-04-19 DIAGNOSIS — I2510 Atherosclerotic heart disease of native coronary artery without angina pectoris: Secondary | ICD-10-CM | POA: Diagnosis not present

## 2023-04-19 DIAGNOSIS — M4696 Unspecified inflammatory spondylopathy, lumbar region: Secondary | ICD-10-CM | POA: Diagnosis not present

## 2023-04-19 DIAGNOSIS — Z0000 Encounter for general adult medical examination without abnormal findings: Secondary | ICD-10-CM | POA: Diagnosis not present

## 2023-05-03 DIAGNOSIS — H401132 Primary open-angle glaucoma, bilateral, moderate stage: Secondary | ICD-10-CM | POA: Diagnosis not present

## 2023-05-03 DIAGNOSIS — Z961 Presence of intraocular lens: Secondary | ICD-10-CM | POA: Diagnosis not present

## 2023-05-03 DIAGNOSIS — H04123 Dry eye syndrome of bilateral lacrimal glands: Secondary | ICD-10-CM | POA: Diagnosis not present

## 2023-05-03 DIAGNOSIS — H353131 Nonexudative age-related macular degeneration, bilateral, early dry stage: Secondary | ICD-10-CM | POA: Diagnosis not present

## 2023-09-30 NOTE — Progress Notes (Unsigned)
 HPI: FU CAD.  Nuclear study March 2020 significantly abnormal.  Cardiac catheterization March 2020 showed a 99% proximal LAD, 90% ostial first diagonal, 70% apical LAD, 60% circumflex and 60% right coronary artery.  Carotid Dopplers March 2020 showed 1 to 39% bilateral stenosis. Echo March 2020 showed normal LV function, mild diastolic dysfunction.  Patient subsequently underwent coronary artery bypass and graft with a LIMA to the LAD and saphenous vein graft to the second diagonal. Abdominal ultrasound June 2020 showed no aneurysm.  Since last seen the patient denies any dyspnea on exertion, orthopnea, PND, pedal edema, palpitations, syncope or chest pain.   Current Outpatient Medications  Medication Sig Dispense Refill   acetaminophen  (TYLENOL ) 500 MG tablet Take 500 mg by mouth daily.     ALPRAZolam  (XANAX ) 0.5 MG tablet Take 0.25 mg by mouth 2 (two) times daily as needed for anxiety or sleep.      amLODipine  (NORVASC ) 5 MG tablet Take 5 mg by mouth daily.  0   aspirin  EC 81 MG tablet Take 81 mg by mouth daily.     carboxymethylcellulose (REFRESH PLUS) 0.5 % SOLN Place 1 drop into both eyes daily as needed (dry/irritated eyes.).      cyclobenzaprine  (FLEXERIL ) 10 MG tablet Take 1 tablet (10 mg total) by mouth 3 (three) times daily as needed for muscle spasms. 30 tablet 0   dorzolamide -timolol  (COSOPT ) 22.3-6.8 MG/ML ophthalmic solution Place 1 drop into both eyes 2 (two) times daily.   6   famotidine  (PEPCID ) 20 MG tablet Take 20 mg by mouth daily as needed for heartburn.     HYDROcodone -acetaminophen  (NORCO/VICODIN) 5-325 MG tablet Take 1 tablet by mouth every 4 (four) hours as needed for moderate pain ((score 4 to 6)). 30 tablet 0   latanoprost  (XALATAN ) 0.005 % ophthalmic solution Place 1 drop into both eyes at bedtime.   2   metoprolol  succinate (TOPROL -XL) 25 MG 24 hr tablet Take 1 tablet by mouth once daily 90 tablet 3   Misc Natural Products (OSTEO BI-FLEX JOINT SHIELD) TABS Take 1  tablet by mouth daily.      rosuvastatin  (CRESTOR ) 40 MG tablet TAKE 1 TABLET BY MOUTH AT BEDTIME 90 tablet 3   tamsulosin  (FLOMAX ) 0.4 MG CAPS capsule TAKE 1 CAPSULE BY MOUTH ONCE DAILY AFTER SUPPER (Patient taking differently: Take 0.4 mg by mouth daily as needed (urine flow).) 30 capsule 0   No current facility-administered medications for this visit.     Past Medical History:  Diagnosis Date   Anxiety    Arthritis    Carpal tunnel syndrome of left wrist    Coronary artery disease    GERD (gastroesophageal reflux disease)    Glaucoma, both eyes    Hx of colonic polyps    Hypertension    Nocturia    Prostate cancer [facility]) urologist-  dr borden/  oncologist-- dr Lorri Rota   dx 08/ 2018 Gleason 3+3, PSA 6.8 active surveillane/  dx 09-15-2017 Stage T2a, Gleason 3+4, PSA 3.74-- plan external beam radiation and ADT    Past Surgical History:  Procedure Laterality Date   CARPAL TUNNEL RELEASE Right 06-30-2007   dr sypher   w/  A-1 pulley release right index and long fingers   CATARACT EXTRACTION W/ INTRAOCULAR LENS  IMPLANT, BILATERAL  2012 approx.   COLONOSCOPY W/ POLYPECTOMY     CORONARY ARTERY BYPASS GRAFT N/A 09/01/2018   Procedure: CORONARY ARTERY BYPASS GRAFTING (CABG)  x two, using left internal mammary and  right saphenous vein (endoscopic vein harvest).;  Surgeon: Bartley Lightning, MD;  Location: [facility] OR;  Service: Open Heart Surgery;  Laterality: N/A;   GOLD SEED IMPLANT N/A 11/14/2017   Procedure: GOLD SEED IMPLANT;  Surgeon: Florencio Hunting, MD;  Location: [facility];  Service: Urology;  Laterality: N/A;  Needs Ultrasound Tech   LAPAROSCOPY CECECTOMY W/ APPENDECTOMY  05-20-2009  dr Linell Rhymes   cecal polyp at appendiceal base   LEFT HEART CATH AND CORONARY ANGIOGRAPHY N/A 08/30/2018   Procedure: LEFT HEART CATH AND CORONARY ANGIOGRAPHY;  Surgeon: Arty Binning, MD;  Location: [facility] INVASIVE CV LAB;  Service: Cardiovascular;  Laterality: N/A;   LUMBAR  LAMINECTOMY/DECOMPRESSION MICRODISCECTOMY Bilateral 11/29/2019   Procedure: Laminectomy for facet/synovial cyst - bilateral - Lumbar four-Lumbar five;  Surgeon: Agustina Aldrich, MD;  Location: [facility] OR;  Service: Neurosurgery;  Laterality: Bilateral;   PROSTATE BIOPSY  09-15-2017;  08/ 2018   SHOULDER ARTHROSCOPY DISTAL CLAVICLE EXCISION AND OPEN ROTATOR CUFF REPAIR Right 08-06-2008   dr sypher   w/ debridement, SCD, bursectomy, acrominoplasty and Biceps tenodesis   SHOULDER ARTHROSCOPY WITH OPEN ROTATOR CUFF REPAIR Left 2018   "and tendon repair's"   SPACE OAR INSTILLATION N/A 11/14/2017   Procedure: SPACE OAR INSTILLATION;  Surgeon: Florencio Hunting, MD;  Location: [facility];  Service: Urology;  Laterality: N/A;   TEE WITHOUT CARDIOVERSION N/A 09/01/2018   Procedure: TRANSESOPHAGEAL ECHOCARDIOGRAM (TEE);  Surgeon: Bartley Lightning, MD;  Location: [facility] OR;  Service: Open Heart Surgery;  Laterality: N/A;    Social History   Socioeconomic History   Marital status: Married    Spouse name: Marily Shows   Number of children: 2   Years of education: Not on file   Highest education level: Not on file  Occupational History   Occupation: retired  Tobacco Use   Smoking status: Former    Current packs/day: 0.00    Types: Cigarettes    Start date: 11/10/1961    Quit date: 11/11/1986    Years since quitting: 36.9   Smokeless tobacco: Former    Types: Chew    Quit date: 11/10/1988  Vaping Use   Vaping status: Never Used  Substance and Sexual Activity   Alcohol  use: Yes    Alcohol /week: 3.0 standard drinks of alcohol     Types: 3 Cans of beer per week   Drug use: Never   Sexual activity: Yes  Other Topics Concern   Not on file  Social History Narrative   Not on file   Social Drivers of Health   Financial Resource Strain: Not on file  Food Insecurity: Not on file  Transportation Needs: Not on file  Physical Activity: Not on file  Stress: Not on file  Social Connections: Not on file   Intimate Partner Violence: Not on file    Family History  Problem Relation Age of Onset   Prostate cancer Brother    Hodgkin's lymphoma Daughter    Breast cancer Daughter    Melanoma Daughter    Throat cancer Father    Breast cancer Sister    Breast cancer Sister    Thyroid  cancer Sister     ROS: no fevers or chills, productive cough, hemoptysis, dysphasia, odynophagia, melena, hematochezia, dysuria, hematuria, rash, seizure activity, orthopnea, PND, pedal edema, claudication. Remaining systems are negative.  Physical Exam: Well-developed well-nourished in no acute distress.  Skin is warm and dry.  HEENT is normal.  Neck is supple.  Chest is clear to auscultation with  normal expansion.  Cardiovascular exam is regular rate and rhythm.  Abdominal exam nontender or distended. No masses palpated. Extremities show no edema. neuro grossly intact  EKG Interpretation Date/Time:  Thursday October 06 2023 09:04:59 EDT Ventricular Rate:  52 PR Interval:  174 QRS Duration:  112 QT Interval:  464 QTC Calculation: 431 R Axis:   -62  Text Interpretation: Sinus bradycardia Left anterior fascicular block Minimal voltage criteria for LVH, may be normal variant ( Cornell product ) Confirmed by Alexandria Angel ([id number]) on 10/06/2023 9:10:35 AM    A/P  1 coronary artery disease status post coronary bypass graft-patient doing well from a symptomatic standpoint.  Plan to continue present medications including aspirin  and statin.  2 hyperlipidemia-continue Crestor .  Will have most recent lipids and liver forwarded to us  from primary care.  3 hypertension-patient's blood pressure is controlled.  Continue present medications.  Will have most recent renal function forwarded to us  from primary care.   Alexandria Angel, MD

## 2023-10-06 ENCOUNTER — Encounter: Payer: Self-pay | Admitting: Cardiology

## 2023-10-06 ENCOUNTER — Ambulatory Visit: Payer: Medicare Other | Attending: Cardiology | Admitting: Cardiology

## 2023-10-06 VITALS — BP 124/58 | HR 52 | Ht 70.0 in | Wt 180.0 lb

## 2023-10-06 DIAGNOSIS — E7800 Pure hypercholesterolemia, unspecified: Secondary | ICD-10-CM

## 2023-10-06 DIAGNOSIS — I2510 Atherosclerotic heart disease of native coronary artery without angina pectoris: Secondary | ICD-10-CM

## 2023-10-06 DIAGNOSIS — I10 Essential (primary) hypertension: Secondary | ICD-10-CM | POA: Diagnosis not present

## 2023-10-06 NOTE — Patient Instructions (Signed)
 Medication Instructions:  Your physician recommends that you continue on your current medications as directed. Please refer to the Current Medication list given to you today.  *If you need a refill on your cardiac medications before your next appointment, please call your pharmacy*   Follow-Up: At [facility], you and your health needs are our priority.  As part of our continuing mission to provide you with exceptional heart care, our providers are all part of one team.  This team includes your primary Cardiologist (physician) and Advanced Practice Providers or APPs (Physician Assistants and Nurse Practitioners) who all work together to provide you with the care you need, when you need it.  Your next appointment:   1 year(s)  Provider:   Alexandria Angel, MD    We recommend signing up for the patient portal called "MyChart".  Sign up information is provided on this After Visit Summary.  MyChart is used to connect with patients for Virtual Visits (Telemedicine).  Patients are able to view lab/test results, encounter notes, upcoming appointments, etc.  Non-urgent messages can be sent to your provider as well.   To learn more about what you can do with MyChart, go to [email or web address].      1st Floor: - Lobby - Registration  - Pharmacy  - Lab - Cafe  2nd Floor: - PV Lab - Diagnostic Testing (echo, CT, nuclear med)  3rd Floor: - Vacant  4th Floor: - TCTS (cardiothoracic surgery) - AFib Clinic - Structural Heart Clinic - Vascular Surgery  - Vascular Ultrasound  5th Floor: - HeartCare Cardiology (general and EP) - Clinical Pharmacy for coumadin, hypertension, lipid, weight-loss medications, and med management appointments    Valet parking services will be available as well.

## 2023-10-07 LAB — LAB REPORT - SCANNED: EGFR: 66

## 2023-10-18 DIAGNOSIS — J310 Chronic rhinitis: Secondary | ICD-10-CM | POA: Diagnosis not present

## 2023-10-18 DIAGNOSIS — I700 Atherosclerosis of aorta: Secondary | ICD-10-CM | POA: Diagnosis not present

## 2023-10-18 DIAGNOSIS — I10 Essential (primary) hypertension: Secondary | ICD-10-CM | POA: Diagnosis not present

## 2023-10-18 DIAGNOSIS — I25118 Atherosclerotic heart disease of native coronary artery with other forms of angina pectoris: Secondary | ICD-10-CM | POA: Diagnosis not present

## 2023-10-18 DIAGNOSIS — E782 Mixed hyperlipidemia: Secondary | ICD-10-CM | POA: Diagnosis not present

## 2023-11-25 DIAGNOSIS — Z961 Presence of intraocular lens: Secondary | ICD-10-CM | POA: Diagnosis not present

## 2023-11-25 DIAGNOSIS — H04123 Dry eye syndrome of bilateral lacrimal glands: Secondary | ICD-10-CM | POA: Diagnosis not present

## 2023-11-25 DIAGNOSIS — H353131 Nonexudative age-related macular degeneration, bilateral, early dry stage: Secondary | ICD-10-CM | POA: Diagnosis not present

## 2023-11-25 DIAGNOSIS — H401132 Primary open-angle glaucoma, bilateral, moderate stage: Secondary | ICD-10-CM | POA: Diagnosis not present

## 2024-04-28 ENCOUNTER — Emergency Department (HOSPITAL_BASED_OUTPATIENT_CLINIC_OR_DEPARTMENT_OTHER)
Admission: EM | Admit: 2024-04-28 | Discharge: 2024-04-28 | Disposition: A | Discharge status: Home / Self Care | Attending: Emergency Medicine | Admitting: Emergency Medicine

## 2024-04-28 ENCOUNTER — Encounter (HOSPITAL_BASED_OUTPATIENT_CLINIC_OR_DEPARTMENT_OTHER): Payer: Self-pay | Admitting: Emergency Medicine

## 2024-04-28 ENCOUNTER — Other Ambulatory Visit: Payer: Self-pay

## 2024-04-28 DIAGNOSIS — L270 Generalized skin eruption due to drugs and medicaments taken internally: Secondary | ICD-10-CM | POA: Insufficient documentation

## 2024-04-28 DIAGNOSIS — I10 Essential (primary) hypertension: Secondary | ICD-10-CM | POA: Insufficient documentation

## 2024-04-28 DIAGNOSIS — Z79899 Other long term (current) drug therapy: Secondary | ICD-10-CM | POA: Diagnosis not present

## 2024-04-28 DIAGNOSIS — R21 Rash and other nonspecific skin eruption: Secondary | ICD-10-CM | POA: Diagnosis present

## 2024-04-28 DIAGNOSIS — Z7982 Long term (current) use of aspirin: Secondary | ICD-10-CM | POA: Insufficient documentation

## 2024-04-28 NOTE — ED Provider Notes (Signed)
 [place] EMERGENCY DEPARTMENT AT [facility] Provider Note   CSN: [id number] Arrival date & time: 04/28/24  1800     Patient presents with: Rash   Jazen CHUKWUEBUKA CHURCHILL is a 83 y.o. male.   Patient is an 83 year old male with a past medical history of hypertension, GERD and anxiety presenting to the emergency department for rash.  The patient states that he saw his primary doctor on Tuesday for an annual appointment and he received his COVID and flu vaccines and was started on Wellbutrin as well as a topical fluconazole.  He states that he started to have some itching Tuesday night and on Wednesday noticed a rash of his entire trunk.  He states that it has not spread past his trunk.  He states that he has been using hydrocortisone cream and taking Benadryl to help with the itching.  Denies any associated fevers or bodyaches.  He states that he has had the COVID and flu vaccines previous years without any prior reactions.  Denies any other new soaps, lotions or detergents.  The history is provided by the patient and a relative.  Rash      Prior to Admission medications   Medication Sig Start Date End Date Taking? Authorizing Provider  acetaminophen  (TYLENOL ) 500 MG tablet Take 500 mg by mouth daily.    [provider]  ALPRAZolam  (XANAX ) 0.5 MG tablet Take 0.25 mg by mouth 2 (two) times daily as needed for anxiety or sleep.  02/01/19   [provider]  amLODipine  (NORVASC ) 5 MG tablet Take 5 mg by mouth daily. 07/06/17   [provider]  aspirin  EC 81 MG tablet Take 81 mg by mouth daily.    [provider]  carboxymethylcellulose (REFRESH PLUS) 0.5 % SOLN Place 1 drop into both eyes daily as needed (dry/irritated eyes.).     [provider]  cyclobenzaprine  (FLEXERIL ) 10 MG tablet Take 1 tablet (10 mg total) by mouth 3 (three) times daily as needed for muscle spasms. 11/29/19   Louis Shove, MD  dorzolamide -timolol  (COSOPT ) 22.3-6.8  MG/ML ophthalmic solution Place 1 drop into both eyes 2 (two) times daily.  09/21/17   [provider]  famotidine  (PEPCID ) 20 MG tablet Take 20 mg by mouth daily as needed for heartburn.    [provider]  HYDROcodone -acetaminophen  (NORCO/VICODIN) 5-325 MG tablet Take 1 tablet by mouth every 4 (four) hours as needed for moderate pain ((score 4 to 6)). 11/29/19   Louis Shove, MD  latanoprost  (XALATAN ) 0.005 % ophthalmic solution Place 1 drop into both eyes at bedtime.  09/10/17   [provider]  metoprolol  succinate (TOPROL -XL) 25 MG 24 hr tablet Take 1 tablet by mouth once daily 11/10/21   Crenshaw, Redell RAMAN, MD  Misc Natural Products (OSTEO BI-FLEX JOINT SHIELD) TABS Take 1 tablet by mouth daily.     [provider]  rosuvastatin  (CRESTOR ) 40 MG tablet TAKE 1 TABLET BY MOUTH AT BEDTIME 08/07/21   Pietro Redell RAMAN, MD  tamsulosin  (FLOMAX ) 0.4 MG CAPS capsule TAKE 1 CAPSULE BY MOUTH ONCE DAILY AFTER SUPPER Patient taking differently: Take 0.4 mg by mouth daily as needed (urine flow). 10/09/18   Patrcia Cough, MD    Allergies: Tetanus toxoid-containing vaccines, Levaquin [levofloxacin], and Wellbutrin [bupropion]    Review of Systems  Skin:  Positive for rash.    Updated Vital Signs BP (!) 155/86 (BP Location: Left Arm)   Pulse 60   Temp 98.1 F (36.7 C)  Resp 18   SpO2 99%   Physical Exam Vitals and nursing note reviewed.  Constitutional:      General: He is not in acute distress.    Appearance: Normal appearance.  HENT:     Head: Normocephalic.     Nose: Nose normal.     Mouth/Throat:     Mouth: Mucous membranes are moist.  Eyes:     Extraocular Movements: Extraocular movements intact.  Musculoskeletal:        General: Normal range of motion.  Skin:    General: Skin is warm and dry.     Findings: Rash (Dry appearing macular papular rash involving the anterior and posterior trunk) present.  Neurological:     Mental Status: He is alert and  oriented to person, place, and time.  Psychiatric:        Mood and Affect: Mood normal.        Behavior: Behavior normal.     (all labs ordered are listed, but only abnormal results are displayed) Labs Reviewed - No data to display  EKG: None  Radiology: No results found.   Procedures   Medications Ordered in the ED - No data to display                                  Medical Decision Making This patient presents to the ED with chief complaint(s) of rash with pertinent past medical history of hypertension, GERD, anxiety which further complicates the presenting complaint. The complaint involves an extensive differential diagnosis and also carries with it a high risk of complications and morbidity.    The differential diagnosis includes drug eruption, contact dermatitis, no signs of SJS/TEN, no urticaria or signs of other allergic reaction  Additional history obtained: Additional history obtained from family Records reviewed Primary Care Documents  ED Course and Reassessment: On patient's arrival he is hemodynamically stable in no acute distress.  History and exam appear most consistent with a drug eruption likely from the Wellbutrin.  It is recommended to discontinue this and continue supportive care with Benadryl and Zyrtec.  He is recommended to continue topical hydrocortisone and moisturizing creams or ointments.  He was recommended close primary care follow-up.  Independent labs interpretation:  N/A  Independent visualization of imaging: -N/A  Consultation: - Consulted or discussed management/test interpretation w/ external professional: N/A  Consideration for admission or further workup: Patient has no emergent conditions requiring admission or further work-up at this time and is stable for discharge home with primary care follow-up  Social Determinants of health: N/A         Final diagnoses:  Drug-induced maculopapular rash    ED Discharge Orders      None          Ellouise Richerd POUR, DO 04/28/24 1857

## 2024-04-28 NOTE — ED Triage Notes (Signed)
 Pt reports starting new meds this week including wellbutrin.  Had his covid and flu shots this week.  Pt reports painful rash to trunk.  Rash began the day after starting new meds.   Redness noted to trunk at time of triage.

## 2024-04-28 NOTE — Discharge Instructions (Signed)
 You were seen in the emergency department for your rash.  This appears consistent with a drug rash, likely from your Wellbutrin.  You should stop taking the Wellbutrin to help with the rash resolve.  You can take Zyrtec daily and Benadryl as needed to help with the itching as well as continue the topical hydrocortisone cream as well as a barrier cream or ointment to help with the dryness and itching.  You can follow-up with your primary doctor in the next few days to have your symptoms rechecked.  You should return to the emergency department if you develop fevers, your skin becomes blistering and peeling, you have severe shortness of breath, swelling to your tongue lips or throat or any other new or concerning symptoms.
